# Patient Record
Sex: Male | Born: 1975 | Race: Black or African American | Hispanic: No | Marital: Single | Smoking: Current every day smoker
Health system: Southern US, Community
[De-identification: ages and names within clinical notes are randomized; demographics above are authoritative.]

## PROBLEM LIST (undated history)

## (undated) DIAGNOSIS — M329 Systemic lupus erythematosus, unspecified: Secondary | ICD-10-CM

## (undated) DIAGNOSIS — R040 Epistaxis: Secondary | ICD-10-CM

## (undated) DIAGNOSIS — T7840XA Allergy, unspecified, initial encounter: Secondary | ICD-10-CM

## (undated) DIAGNOSIS — IMO0002 Reserved for concepts with insufficient information to code with codable children: Secondary | ICD-10-CM

## (undated) DIAGNOSIS — I10 Essential (primary) hypertension: Secondary | ICD-10-CM

## (undated) DIAGNOSIS — M1990 Unspecified osteoarthritis, unspecified site: Secondary | ICD-10-CM

## (undated) HISTORY — DX: Allergy, unspecified, initial encounter: T78.40XA

## (undated) HISTORY — DX: Unspecified osteoarthritis, unspecified site: M19.90

---
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
## 1997-05-26 ENCOUNTER — Emergency Department (HOSPITAL_COMMUNITY): Admission: EM | Admit: 1997-05-26 | Discharge: 1997-05-26 | Payer: Self-pay | Admitting: Emergency Medicine

## 1997-12-25 ENCOUNTER — Encounter: Payer: Self-pay | Admitting: Emergency Medicine

## 1997-12-25 ENCOUNTER — Inpatient Hospital Stay (HOSPITAL_COMMUNITY): Admission: EM | Admit: 1997-12-25 | Discharge: 1998-01-03 | Payer: Self-pay | Admitting: Emergency Medicine

## 1997-12-26 ENCOUNTER — Encounter: Payer: Self-pay | Admitting: Surgery

## 1997-12-28 ENCOUNTER — Encounter: Payer: Self-pay | Admitting: Surgery

## 1998-01-02 ENCOUNTER — Encounter: Payer: Self-pay | Admitting: Surgery

## 1998-01-13 ENCOUNTER — Inpatient Hospital Stay (HOSPITAL_COMMUNITY): Admission: AD | Admit: 1998-01-13 | Discharge: 1998-01-14 | Payer: Self-pay | Admitting: *Deleted

## 1999-07-16 ENCOUNTER — Emergency Department (HOSPITAL_COMMUNITY): Admission: EM | Admit: 1999-07-16 | Discharge: 1999-07-16 | Payer: Self-pay | Admitting: Emergency Medicine

## 2000-02-19 HISTORY — PX: ABDOMINAL SURGERY: SHX537

## 2001-06-06 ENCOUNTER — Emergency Department (HOSPITAL_COMMUNITY): Admission: EM | Admit: 2001-06-06 | Discharge: 2001-06-06 | Payer: Self-pay | Admitting: Emergency Medicine

## 2008-11-08 ENCOUNTER — Ambulatory Visit: Payer: Self-pay | Admitting: Physician Assistant

## 2008-11-08 DIAGNOSIS — F430 Acute stress reaction: Secondary | ICD-10-CM | POA: Insufficient documentation

## 2008-11-08 DIAGNOSIS — R82998 Other abnormal findings in urine: Secondary | ICD-10-CM | POA: Insufficient documentation

## 2008-11-08 LAB — CONVERTED CEMR LAB
Bilirubin Urine: NEGATIVE
Blood in Urine, dipstick: NEGATIVE
Glucose, Urine, Semiquant: NEGATIVE
Ketones, urine, test strip: NEGATIVE
Nitrite: NEGATIVE
Protein, U semiquant: 100
Specific Gravity, Urine: >=1.03
Urobilinogen, UA: 0.2
pH: 6

## 2008-11-11 ENCOUNTER — Encounter (INDEPENDENT_AMBULATORY_CARE_PROVIDER_SITE_OTHER): Payer: Self-pay | Admitting: *Deleted

## 2008-11-11 LAB — CONVERTED CEMR LAB
ALT: 60 units/L — ABNORMAL HIGH (ref 0–53)
ANA Titer 1: NEGATIVE
AST: 40 units/L — ABNORMAL HIGH (ref 0–37)
Albumin: 4.6 g/dL (ref 3.5–5.2)
Alkaline Phosphatase: 68 units/L (ref 39–117)
Anti Nuclear Antibody(ANA): POSITIVE — AB
BUN: 14 mg/dL (ref 6–23)
Basophils Absolute: 0 10*3/uL (ref 0.0–0.1)
Basophils Relative: 1 % (ref 0–1)
CO2: 24 meq/L (ref 19–32)
Calcium: 9.6 mg/dL (ref 8.4–10.5)
Chloride: 105 meq/L (ref 96–112)
Creatinine, Ser: 1.04 mg/dL (ref 0.40–1.50)
Eosinophils Absolute: 0.1 10*3/uL (ref 0.0–0.7)
Eosinophils Relative: 1 % (ref 0–5)
Glucose, Bld: 83 mg/dL (ref 70–99)
HCT: 44.5 % (ref 39.0–52.0)
Hemoglobin: 15 g/dL (ref 13.0–17.0)
Lymphocytes Relative: 47 % — ABNORMAL HIGH (ref 12–46)
Lymphs Abs: 2 10*3/uL (ref 0.7–4.0)
MCHC: 33.7 g/dL (ref 30.0–36.0)
MCV: 84.6 fL (ref 78.0–100.0)
Monocytes Absolute: 0.6 10*3/uL (ref 0.1–1.0)
Monocytes Relative: 13 % — ABNORMAL HIGH (ref 3–12)
Neutro Abs: 1.6 10*3/uL — ABNORMAL LOW (ref 1.7–7.7)
Neutrophils Relative %: 38 % — ABNORMAL LOW (ref 43–77)
Platelets: 226 10*3/uL (ref 150–400)
Potassium: 4.6 meq/L (ref 3.5–5.3)
RBC: 5.26 M/uL (ref 4.22–5.81)
RDW: 14.2 % (ref 11.5–15.5)
Sed Rate: 10 mm/hr (ref 0–16)
Sodium: 139 meq/L (ref 135–145)
TSH: 1.252 microintl units/mL (ref 0.350–4.500)
Total Bilirubin: 0.3 mg/dL (ref 0.3–1.2)
Total Protein: 8 g/dL (ref 6.0–8.3)
WBC: 4.2 10*3/uL (ref 4.0–10.5)

## 2008-11-14 ENCOUNTER — Encounter: Payer: Self-pay | Admitting: Physician Assistant

## 2008-12-07 ENCOUNTER — Encounter: Payer: Self-pay | Admitting: Physician Assistant

## 2008-12-16 ENCOUNTER — Encounter: Payer: Self-pay | Admitting: Physician Assistant

## 2008-12-16 DIAGNOSIS — L930 Discoid lupus erythematosus: Secondary | ICD-10-CM

## 2009-12-22 ENCOUNTER — Encounter: Payer: Self-pay | Admitting: Physician Assistant

## 2010-01-05 ENCOUNTER — Encounter: Payer: Self-pay | Admitting: Physician Assistant

## 2010-02-20 ENCOUNTER — Ambulatory Visit
Admission: RE | Admit: 2010-02-20 | Discharge: 2010-02-20 | Payer: Self-pay | Source: Home / Self Care | Attending: Nurse Practitioner | Admitting: Nurse Practitioner

## 2010-02-20 ENCOUNTER — Ambulatory Visit: Admit: 2010-02-20 | Payer: Self-pay | Admitting: Internal Medicine

## 2010-02-22 ENCOUNTER — Ambulatory Visit
Admission: RE | Admit: 2010-02-22 | Discharge: 2010-02-22 | Payer: Self-pay | Source: Home / Self Care | Attending: Internal Medicine | Admitting: Internal Medicine

## 2010-03-20 NOTE — Letter (Signed)
Summary: MAILED REQUESTED RECORDS TO [facility]  MAILED REQUESTED RECORDS TO [facility]   Imported By: Arta Bruce 12/22/2009 12:21:36  _____________________________________________________________________  External Attachment:    Type:   Image     Comment:   External Document

## 2010-03-20 NOTE — Letter (Signed)
Summary: MAILED REQUESTED RECORDS TO [facility]  MAILED REQUESTED RECORDS TO [facility]   Imported By: Arta Bruce 01/05/2010 11:56:04  _____________________________________________________________________  External Attachment:    Type:   Image     Comment:   External Document

## 2010-03-21 ENCOUNTER — Ambulatory Visit: Admit: 2010-03-21 | Payer: Self-pay | Admitting: Nurse Practitioner

## 2010-03-21 ENCOUNTER — Encounter (INDEPENDENT_AMBULATORY_CARE_PROVIDER_SITE_OTHER): Payer: Self-pay | Admitting: Nurse Practitioner

## 2010-03-21 ENCOUNTER — Encounter: Payer: Self-pay | Admitting: Nurse Practitioner

## 2010-03-21 DIAGNOSIS — K029 Dental caries, unspecified: Secondary | ICD-10-CM | POA: Insufficient documentation

## 2010-03-21 DIAGNOSIS — R030 Elevated blood-pressure reading, without diagnosis of hypertension: Secondary | ICD-10-CM | POA: Insufficient documentation

## 2010-03-22 LAB — CONVERTED CEMR LAB
Alkaline Phosphatase: 68 units/L (ref 39–117)
BUN: 12 mg/dL (ref 6–23)
Eosinophils Absolute: 0.1 10*3/uL (ref 0.0–0.7)
Eosinophils Relative: 2 % (ref 0–5)
Glucose, Bld: 67 mg/dL — ABNORMAL LOW (ref 70–99)
HCT: 42.3 % (ref 39.0–52.0)
Lymphs Abs: 1.4 10*3/uL (ref 0.7–4.0)
MCV: 85.5 fL (ref 78.0–100.0)
Platelets: 242 10*3/uL (ref 150–400)
Sodium: 140 meq/L (ref 135–145)
Total Bilirubin: 0.4 mg/dL (ref 0.3–1.2)
Total Protein: 7.5 g/dL (ref 6.0–8.3)
WBC: 3.3 10*3/uL — ABNORMAL LOW (ref 4.0–10.5)

## 2010-03-26 ENCOUNTER — Encounter (INDEPENDENT_AMBULATORY_CARE_PROVIDER_SITE_OTHER): Payer: Self-pay | Admitting: Nurse Practitioner

## 2010-03-26 LAB — CONVERTED CEMR LAB
HCV Ab: NEGATIVE
Hep A Total Ab: NEGATIVE

## 2010-03-28 ENCOUNTER — Telehealth (INDEPENDENT_AMBULATORY_CARE_PROVIDER_SITE_OTHER): Payer: Self-pay | Admitting: Nurse Practitioner

## 2010-03-28 DIAGNOSIS — R748 Abnormal levels of other serum enzymes: Secondary | ICD-10-CM | POA: Insufficient documentation

## 2010-03-28 NOTE — Assessment & Plan Note (Signed)
Summary: Lupus/Elevated BP   Vital Signs:  Patient profile:   35 year old male Height:      66.50 inches Weight:      184.1 pounds BMI:     29.38 Temp:     97.0 degrees F oral Pulse rate:   72 / minute Pulse rhythm:   regular Resp:     16 per minute BP sitting:   130 / 100  (left arm) Cuff size:   large  Vitals Entered By: Levon Hedger (March 21, 2010 10:06 AM)  Nutrition Counseling: Patient's BMI is greater than 25 and therefore counseled on weight management options. CC: needs referral to dermatology and to the dentist Is Patient Diabetic? No Pain Assessment Patient in pain? no       Does patient need assistance? Functional Status Self care Ambulation Normal   CC:  needs referral to dermatology and to the dentist.  History of Present Illness:  Pt into the office for f/u.  Lupus - Pt was dx after going to dermatology.  Pt was started on both pills and cream.  Pt reports that he has not followed up as ordered because his eligibility expired. He was not able to f/u with dermatology and so he is not taking the pills.    Dental - No dental exam in the past several years. Pt does have a tooth in his left lower molar that needs extraction.  He is requesting dental clnic referral  Habits & Providers  Alcohol-Tobacco-Diet     Alcohol drinks/day: <1     Alcohol type: beer     Feels need to cut down: yes     Feels annoyed by complaints: no     Feels guilty re: drinking: no     Needs 'eye opener' in am: no     Tobacco Status: current     Tobacco Counseling: to quit use of tobacco products     Cigarette Packs/Day: <0.25     Year Started: 1995  Exercise-Depression-Behavior     Does Patient Exercise: yes     Type of exercise: treadmill, lift weights     Exercise (avg: min/session): 30-60     Times/week: 3     STD Risk: never     Drug Use: never     Seat Belt Use: always  Medications Prior to Update: 1)  None  Current Medications (verified): 1)   None  Allergies (verified): No Known Drug Allergies  Review of Systems General:  Complains of fatigue; denies fever. CV:  Denies chest pain or discomfort. Resp:  Denies cough. GI:  Denies abdominal pain, nausea, and vomiting. Derm:  Complains of rash.  Physical Exam  General:  alert.   Head:  normocephalic.   Lungs:  normal breath sounds.   Heart:  normal rate and regular rhythm.   Abdomen:  normal bowel sounds.   Msk:  up to the exam table Neurologic:  alert & oriented X3.   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 Rx by dermatology  Problem # 2:  ELEVATED BLOOD PRESSURE WITHOUT DIAGNOSIS OF HYPERTENSION (ICD-796.2)  DASH diet reviewed advised pt to monitor Orders: T-Comprehensive Metabolic Panel [phone]) T-CBC w/Diff ([id number]) T-TSH ([id number])  Problem # 3:  DENTAL CARIES (ICD-521.00) will refer to dental clinic Orders: Dental Referral (Dentist)  Complete Medication List: 1)  Plaquenil 200 Mg Tabs (Hydroxychloroquine sulfate) .... Rx by dermatology 2)  Fluocinonide 0.05 % Oint (Fluocinonide) .... Rx by derm  Other Orders: Dermatology Referral (Derma)  Patient Instructions: 1)  Elevated blood pressure - your blood pressure is 130/100 today.  This is slighlty elevated.  Be sure to monitor the salt in your diet.   2)  Be sure to monitor this when you go to your local [facility] or [facility] 3)  Eye exam - You can get a basic eye exam from [facility]. 4)  Skin - you have lupus.  You will need referral back to dermatology for managment.   5)  Dental - This will be sent to the  dental clinic.  The dental clinic will call you directly with the time/date of the referral. 6)  Follow up here as needed   Orders Added: 1)  Est. Patient Level III [57846] 2)  Dental Referral [Dentist] 3)  Dermatology Referral [Derma] 4)  T-Comprehensive Metabolic Panel [[id number]] 5)  T-CBC w/Diff [[id number]] 6)  T-TSH [[id number]]

## 2010-03-28 NOTE — Letter (Signed)
Summary: DENTAL REFERRAL  DENTAL REFERRAL   Imported By: Arta Bruce 03/21/2010 11:17:50  _____________________________________________________________________  External Attachment:    Type:   Image     Comment:   External Document

## 2010-03-28 NOTE — Letter (Signed)
Summary: Handout Printed  Printed Handout:  - Lupus

## 2010-03-29 ENCOUNTER — Encounter (INDEPENDENT_AMBULATORY_CARE_PROVIDER_SITE_OTHER): Payer: Self-pay | Admitting: *Deleted

## 2010-03-31 ENCOUNTER — Emergency Department (HOSPITAL_COMMUNITY)
Admission: EM | Admit: 2010-03-31 | Discharge: 2010-03-31 | Disposition: A | Discharge status: Home / Self Care | Payer: Self-pay | Attending: Emergency Medicine | Admitting: Emergency Medicine

## 2010-03-31 DIAGNOSIS — R040 Epistaxis: Secondary | ICD-10-CM | POA: Insufficient documentation

## 2010-03-31 DIAGNOSIS — F17200 Nicotine dependence, unspecified, uncomplicated: Secondary | ICD-10-CM | POA: Insufficient documentation

## 2010-04-01 ENCOUNTER — Emergency Department (HOSPITAL_COMMUNITY)
Admission: EM | Admit: 2010-04-01 | Discharge: 2010-04-01 | Disposition: A | Discharge status: Home / Self Care | Payer: Self-pay | Attending: Emergency Medicine | Admitting: Emergency Medicine

## 2010-04-01 DIAGNOSIS — M329 Systemic lupus erythematosus, unspecified: Secondary | ICD-10-CM | POA: Insufficient documentation

## 2010-04-01 DIAGNOSIS — I10 Essential (primary) hypertension: Secondary | ICD-10-CM | POA: Insufficient documentation

## 2010-04-01 DIAGNOSIS — R040 Epistaxis: Secondary | ICD-10-CM | POA: Insufficient documentation

## 2010-04-02 ENCOUNTER — Emergency Department (HOSPITAL_COMMUNITY)
Admission: EM | Admit: 2010-04-02 | Discharge: 2010-04-03 | Disposition: A | Discharge status: Other Acute Inpatient Hospital | Payer: Self-pay | Attending: Emergency Medicine | Admitting: Emergency Medicine

## 2010-04-02 ENCOUNTER — Emergency Department (HOSPITAL_COMMUNITY)
Admission: EM | Admit: 2010-04-02 | Discharge: 2010-04-02 | Disposition: A | Discharge status: Home / Self Care | Payer: Self-pay | Attending: Emergency Medicine | Admitting: Emergency Medicine

## 2010-04-02 DIAGNOSIS — M329 Systemic lupus erythematosus, unspecified: Secondary | ICD-10-CM | POA: Insufficient documentation

## 2010-04-02 DIAGNOSIS — F17200 Nicotine dependence, unspecified, uncomplicated: Secondary | ICD-10-CM | POA: Insufficient documentation

## 2010-04-02 DIAGNOSIS — R040 Epistaxis: Secondary | ICD-10-CM | POA: Insufficient documentation

## 2010-04-02 DIAGNOSIS — I10 Essential (primary) hypertension: Secondary | ICD-10-CM | POA: Insufficient documentation

## 2010-04-02 LAB — POCT I-STAT, CHEM 8
BUN: 19 mg/dL (ref 6–23)
Chloride: 104 mEq/L (ref 96–112)
Creatinine, Ser: 1.2 mg/dL (ref 0.4–1.5)
Potassium: 3.7 mEq/L (ref 3.5–5.1)
Sodium: 140 mEq/L (ref 135–145)

## 2010-04-02 LAB — CBC
HCT: 33.8 % — ABNORMAL LOW (ref 39.0–52.0)
Hemoglobin: 11.6 g/dL — ABNORMAL LOW (ref 13.0–17.0)
MCH: 28.9 pg (ref 26.0–34.0)
MCHC: 34.3 g/dL (ref 30.0–36.0)

## 2010-04-02 LAB — DIFFERENTIAL
Lymphocytes Relative: 42 % (ref 12–46)
Lymphs Abs: 2 10*3/uL (ref 0.7–4.0)
Monocytes Absolute: 0.4 10*3/uL (ref 0.1–1.0)
Monocytes Relative: 9 % (ref 3–12)
Neutro Abs: 2.3 10*3/uL (ref 1.7–7.7)

## 2010-04-05 ENCOUNTER — Emergency Department (HOSPITAL_COMMUNITY)
Admission: EM | Admit: 2010-04-05 | Discharge: 2010-04-06 | Disposition: A | Discharge status: Home / Self Care | Payer: Self-pay | Attending: Emergency Medicine | Admitting: Emergency Medicine

## 2010-04-05 DIAGNOSIS — R040 Epistaxis: Secondary | ICD-10-CM | POA: Insufficient documentation

## 2010-04-05 DIAGNOSIS — Y849 Medical procedure, unspecified as the cause of abnormal reaction of the patient, or of later complication, without mention of misadventure at the time of the procedure: Secondary | ICD-10-CM | POA: Insufficient documentation

## 2010-04-05 DIAGNOSIS — IMO0002 Reserved for concepts with insufficient information to code with codable children: Secondary | ICD-10-CM | POA: Insufficient documentation

## 2010-04-05 NOTE — Letter (Signed)
Summary: Generic Letter  [facility]  [address] [place], [place] [address]   Phone: [phone]  Fax: [phone]       03/29/2010  DEITRICH STEVE [address] [place], [place]  [address]  Dear Mr. Rainford,   We have been unable to reach you by phone please contact our office.   Sincerely,   Gaylyn Cheers RN

## 2010-04-05 NOTE — Letter (Signed)
Summary: *HSN Results Follow up  [facility]  [address] [place], [place] [address]   Phone: [phone]  Fax: [phone]      03/29/2010   Ray Davis [address] [place], [place]  [address]   Dear  [name],                            ____S.Drinkard,FNP   ____D. Gore,FNP       ____B. McPherson,MD   ____V. Rankins,MD    ____E. Mulberry,MD    ____N. Daphine Deutscher, FNP  ____D. Reche Dixon, MD    ____K. Philipp Deputy, MD    ____Other     This letter is to inform you that your recent test(s):  _______Pap Smear    ___X____Lab Test     _______X-ray    _______ is within acceptable limits  _______ requires a medication change  ____X___ requires a follow-up lab visit  _______ requires a follow-up visit with your provider   Comments:  We have been trying to contact you.  Please give the office a call.       _________________________________________________________ If you have any questions, please contact our office                     Sincerely,  [place] Shannon [facility]

## 2010-04-06 DIAGNOSIS — R040 Epistaxis: Secondary | ICD-10-CM | POA: Insufficient documentation

## 2010-04-06 LAB — DIFFERENTIAL
Basophils Absolute: 0 10*3/uL (ref 0.0–0.1)
Basophils Relative: 0 % (ref 0–1)
Eosinophils Relative: 0 % (ref 0–5)
Lymphocytes Relative: 18 % (ref 12–46)
Neutro Abs: 8.9 10*3/uL — ABNORMAL HIGH (ref 1.7–7.7)

## 2010-04-06 LAB — CBC
HCT: 26.7 % — ABNORMAL LOW (ref 39.0–52.0)
RDW: 13.2 % (ref 11.5–15.5)
WBC: 12.1 10*3/uL — ABNORMAL HIGH (ref 4.0–10.5)

## 2010-04-11 NOTE — Progress Notes (Addendum)
Summary: lab results  Phone Note Outgoing Call   Summary of Call: advise pt that his wbc are low during recent lab visit. will recheck in 4-6 weeks. likely caused by virus liver labs were elevated.  hepatitis is negative so unsure of cause of liver elevation. does the pt drink alcohol?  Initial call taken by: Lehman Prom FNP,  March 28, 2010 2:36 PM  Follow-up for Phone Call        pt number is not accepting incoming calls.... will mail letter.Marland KitchenMarland KitchenMarland KitchenArmenia Shannon  March 29, 2010 11:40 AM   pt number is not accepting incoming calls.Marland KitchenMarland KitchenArmenia Shannon  April 03, 2010 11:06 AM   New Problems: OTHER NONSPECIFIC ABNORMAL SERUM ENZYME LEVELS (ICD-790.5)   New Problems: OTHER NONSPECIFIC ABNORMAL SERUM ENZYME LEVELS (ICD-790.5)  Appended Document: lab results pt says he has been back and forth to hospital since Sat.  because he has blood coming out  nose and throat.... pt says he knows because its draining and he is spitting out blood... pt says he does drink about 2-40 oz. a day.... pt wants to wait to make lab appt because provider might want to see pt.Marland KitchenMarland KitchenArmenia Shannon  April 06, 2010 3:20 PM  I see ER note has been scanned in EMR along with labs done in hospital and procedure note.  Be sure that pt has adequate f/u with ENT or whomever Marilynne Drivers has deemed necessary for him to f/u with. For sake of liver enzymes he should decrease his beer intake but will recheck after his current situation stablizes n.martin,fnp  April 09, 2010  8:01 AM  Pt. advised of provider's response -- has appt. with ENT this afternoon to remove packing.  Will advise re f/u labs after ENT visit.  Verbalized understanding and agreement.  Dutch Quint RN  April 09, 2010 11:15 AM
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
## 2010-04-11 NOTE — Op Note (Signed)
NAME:  Ray Davis, Ray Davis NO.:  [id number]  MEDICAL RECORD NO.:  [id number]           PATIENT TYPE:  E  LOCATION:  WLED                         FACILITY:  [facility]  PHYSICIAN:  Newman Pies, MD            DATE OF BIRTH:  1975/11/19  DATE OF PROCEDURE:  04/03/2010 DATE OF DISCHARGE:  04/03/2010                              OPERATIVE REPORT   PREOPERATIVE DIAGNOSIS:  Severe recurrent left epistaxis.  POSTOPERATIVE DIAGNOSIS:  Severe recurrent left posterior epistaxis.  PROCEDURE PERFORMED:  Left endoscopic control of nasal hemorrhage.  ANESTHESIA:  General endotracheal tube anesthesia.  COMPLICATIONS:  None.  ESTIMATED BLOOD LOSS:  50 mL.  COMPLICATIONS:  None.  INDICATIONS FOR PROCEDURE:  The patient is a 35 year old African American male with a history of recurrent epistaxis over the past 3 days.  The patient was seen in the emergency room 3 times over the past 3 days.  His left epistaxis was packed with the Rhino Rocket and Merocel without significant improvement in the bleeding.  In light of the persistent bleeding, the decision was made for the patient to undergo control of the left nasal hemorrhage in the operating room.  The risks, benefits, alternatives, and details of the procedure were discussed with the patient and his fiancee.  Questions were invited and answered. Informed consent was obtained.  DESCRIPTION OF PROCEDURE:  The patient was taken to the operating room and placed supine on the operating table.  General endotracheal tube anesthesia was administered by the anesthesiologist.  The patient was positioned and prepped and draped in a standard fashion for nasal surgery.  Pledgets soaked with Afrin were placed in both nasal cavities for vasoconstriction.  The pledgets were removed.  Endoscopic evaluation of the right nasal cavity revealed no acute bleeding.  However, large amount of blood clots were noted within the left nasal cavity.  The blood  clots were suctioned.  Endoscopic evaluation of the left nasal cavity revealed multiple bleeding sources from the left posterior lateral nasal wall as well as the nasal septum.  The bleeding sources were cauterized.  The superior nasal cavity was then packed with Surgicel.  Good hemostasis was achieved.  FloSeal was subsequently used to fill the nasal cavity.  A 10 cm anterior- posterior Merocel packing was then placed. Again, the patient was turned over to the anesthesiologist.  The patient was awakened from anesthesia without difficulty.  He was extubated and transferred to the recovery room in good condition.  OPERATIVE FINDINGS:  Left severe posterior epistaxis.  SPECIMEN:  None.  FOLLOWUP CARE:  The patient will be discharged home once he is awake and alert.  I would like to leave the Merocel packing in place for 1 week. He will be placed on Vicodin 1-2 tablets p.o. q.4-6 hours p.r.n. pain, and Keflex 500 mg p.o. q.i.d. for 7 days.  The patient will follow up in my office in 1 week for packing removal.     Newman Pies, MD     ST/MEDQ  D:  04/03/2010  T:  04/03/2010  Job:  [id number]  Electronically  Signed by Newman Pies MD on 04/11/2010 11:33:17 AM

## 2010-04-30 ENCOUNTER — Encounter: Payer: Self-pay | Admitting: Nurse Practitioner

## 2010-04-30 ENCOUNTER — Encounter (INDEPENDENT_AMBULATORY_CARE_PROVIDER_SITE_OTHER): Payer: Self-pay | Admitting: Nurse Practitioner

## 2010-05-08 ENCOUNTER — Encounter (INDEPENDENT_AMBULATORY_CARE_PROVIDER_SITE_OTHER): Payer: Self-pay | Admitting: Nurse Practitioner

## 2010-05-08 LAB — CONVERTED CEMR LAB
ANA Titer 1: NEGATIVE
Basophils Relative: 0 % (ref 0–1)
Hemoglobin: 9.7 g/dL — ABNORMAL LOW (ref 13.0–17.0)
Lymphocytes Relative: 51 % — ABNORMAL HIGH (ref 12–46)
Lymphs Abs: 1.9 10*3/uL (ref 0.7–4.0)
MCHC: 31.1 g/dL (ref 30.0–36.0)
Monocytes Absolute: 0.3 10*3/uL (ref 0.1–1.0)
Monocytes Relative: 9 % (ref 3–12)
Neutro Abs: 1.4 10*3/uL — ABNORMAL LOW (ref 1.7–7.7)
RBC: 3.53 M/uL — ABNORMAL LOW (ref 4.22–5.81)
WBC: 3.7 10*3/uL — ABNORMAL LOW (ref 4.0–10.5)

## 2010-05-08 NOTE — Letter (Signed)
Summary: HEALTH SCREENING  HEALTH SCREENING   Imported By: Arta Bruce 05/04/2010 11:53:02  _____________________________________________________________________  External Attachment:    Type:   Image     Comment:   External Document

## 2010-05-08 NOTE — Assessment & Plan Note (Signed)
Summary: Epistaxis   Vital Signs:  Patient profile:   35 year old male Weight:      178.7 pounds BMI:     28.51 Temp:     98.1 degrees F oral Pulse rate:   75 / minute Pulse rhythm:   regular Resp:     20 per minute BP sitting:   128 / 80  (left arm) Cuff size:   large  Vitals Entered By: Levon Hedger (April 30, 2010 2:52 PM)  Nutrition Counseling: Patient's BMI is greater than 25 and therefore counseled on weight management options. CC: follow-up visit [facility]...needs bloodwork Is Patient Diabetic? No Pain Assessment Patient in pain? no       Does patient need assistance? Functional Status Self care Ambulation Normal   CC:  follow-up visit [facility]...needs bloodwork.  History of Present Illness:  Pt into the office for f/u on epistasis. Pt was seen initially at [facility] long for procedure which attempted to stop bleeding. Unsuccessful so he was advised to go to [facility] and he had another procedure there. Pt missed his last appt with for f/u.  He called and rescheduled the appt  Dermatology - ? is nose bleeding has to do with lupus. He has been referred to a specialist for further work up but pt is not sure to where he has been referred.  Labs done done on last visit showed that he had elevated liver enzymes and decreased WBC. Pt has since decreased his ETOH intake.  Labs done at hospital showed that WBC has normalized  Habits & Providers  Alcohol-Tobacco-Diet     Alcohol drinks/day: <1     Alcohol type: beer     Feels need to cut down: yes     Feels annoyed by complaints: no     Feels guilty re: drinking: no     Needs 'eye opener' in am: no     Tobacco Status: current     Tobacco Counseling: to quit use of tobacco products     Cigarette Packs/Day: <0.25     Year Started: 1995  Exercise-Depression-Behavior     Does Patient Exercise: yes     Type of exercise: treadmill, lift weights     Exercise (avg: min/session): 30-60  Times/week: 3     STD Risk: never     Drug Use: never     Seat Belt Use: always  Allergies (verified): No Known Drug Allergies  Review of Systems General:  Denies fever. CV:  Denies difficulty breathing at night. Resp:  Denies cough. GI:  Denies abdominal pain, nausea, and vomiting.  Physical Exam  General:  alert.   Head:  normocephalic.   Lungs:  normal breath sounds.   Heart:  normal rate and regular rhythm.   Msk:  normal ROM.   Neurologic:  alert & oriented X3.   Skin:  facial discoloration from lupus Psych:  Oriented X3.     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 Rx by dermatology  Problem # 3:  ELEVATED BLOOD PRESSURE WITHOUT DIAGNOSIS OF HYPERTENSION (ICD-796.2) BP is stable today DASH diet  Complete Medication List: 1)  Plaquenil 200 Mg Tabs (Hydroxychloroquine sulfate) .Marland KitchenMarland KitchenMarland Kitchen  Rx by dermatology 2)  Fluocinonide 0.05 % Oint (Fluocinonide) .... Rx by derm  Patient Instructions: 1)  Medications -  2)  Get the pharmacist at [facility] to call the pharmacist here at [facility] pharmacy to transfer the prescription ordered by the Dermatologist.  if it is something available at [facility] pharmacy or something that can be ordered then the medication will cost $10 3)  Labs - bloodwork for your specialist appointment can be done in this office to help lower cost.  Call Dermatology and ask them to fax an order for blood work to this office [phone].   4)  You will then need to schedule a lab visit for get this blood drawn.  You can then return to get a copy of the lab results to take with you to the specialist. 5)  Follow up as  needed   Orders Added: 1)  Est. Patient Level III [45409]

## 2011-06-26 ENCOUNTER — Other Ambulatory Visit (HOSPITAL_COMMUNITY): Payer: Self-pay | Admitting: Internal Medicine

## 2011-06-26 ENCOUNTER — Ambulatory Visit (HOSPITAL_COMMUNITY)
Admission: RE | Admit: 2011-06-26 | Discharge: 2011-06-26 | Disposition: A | Discharge status: Home / Self Care | Payer: Self-pay | Source: Ambulatory Visit | Attending: Internal Medicine | Admitting: Internal Medicine

## 2011-06-26 DIAGNOSIS — M25569 Pain in unspecified knee: Secondary | ICD-10-CM | POA: Insufficient documentation

## 2011-06-26 DIAGNOSIS — R52 Pain, unspecified: Secondary | ICD-10-CM

## 2012-01-07 ENCOUNTER — Emergency Department (HOSPITAL_COMMUNITY)
Admission: EM | Admit: 2012-01-07 | Discharge: 2012-01-07 | Disposition: A | Discharge status: Home / Self Care | Payer: Self-pay | Source: Home / Self Care

## 2012-01-07 ENCOUNTER — Encounter (HOSPITAL_COMMUNITY): Payer: Self-pay | Admitting: *Deleted

## 2012-01-07 DIAGNOSIS — R030 Elevated blood-pressure reading, without diagnosis of hypertension: Secondary | ICD-10-CM

## 2012-01-07 DIAGNOSIS — K029 Dental caries, unspecified: Secondary | ICD-10-CM

## 2012-01-07 DIAGNOSIS — G8929 Other chronic pain: Secondary | ICD-10-CM | POA: Insufficient documentation

## 2012-01-07 DIAGNOSIS — L930 Discoid lupus erythematosus: Secondary | ICD-10-CM

## 2012-01-07 HISTORY — DX: Systemic lupus erythematosus, unspecified: M32.9

## 2012-01-07 HISTORY — DX: Reserved for concepts with insufficient information to code with codable children: IMO0002

## 2012-01-07 HISTORY — DX: Essential (primary) hypertension: I10

## 2012-01-07 MED ORDER — TRAMADOL HCL 50 MG PO TABS: 50.0000 mg | ORAL_TABLET | Freq: Four times a day (QID) | ORAL | Status: DC | PRN

## 2012-01-07 MED ORDER — NAPROXEN 500 MG PO TABS: 500.0000 mg | ORAL_TABLET | Freq: Two times a day (BID) | ORAL | Status: DC

## 2012-01-07 NOTE — ED Provider Notes (Signed)
History     CSN: [id number]  Arrival date & time 01/07/12  1300   None     Chief Complaint  Patient presents with  . Medication Refill  . Hypertension   HPI  Pt reports that he is having a flare of joint pains related to his chronic lupus.  He has not seen his rheumatologist in several months and reports that he is out of his medications and says that he does not know the name of the medications.   He says that his joints hurt all the time especially the knees and reports that he is taking advil and ibuprofen with no significant relief in symptoms.  Pt says that he is fatigued.  He did not bring his old records with him but is willing to go get them from [facility].    Past Medical History  Diagnosis Date  . Lupus   . Hypertension     History reviewed. No pertinent past surgical history.  Family History  Problem Relation Age of Onset  . Family history unknown: Yes    History  Substance Use Topics  . Smoking status: Current Every Day Smoker -- 4.0 packs/day    Types: Cigarettes, Cigars  . Smokeless tobacco: Not on file  . Alcohol Use: 1.2 oz/week    2 Cans of beer per week     Comment: 2 40 oz beer daily     Review of Systems  Constitutional: Positive for diaphoresis and fatigue. Negative for chills, activity change, appetite change and unexpected weight change.  HENT: Negative.   Eyes: Negative.   Respiratory: Negative.   Cardiovascular: Negative.   Gastrointestinal: Negative.   Musculoskeletal: Positive for back pain, joint swelling and arthralgias.  Neurological: Negative.   Hematological: Negative.   Psychiatric/Behavioral: Negative.     Allergies  Review of patient's allergies indicates no known allergies.  Home Medications  No current outpatient prescriptions on file.  BP 116/114  Pulse 68  Temp 98.2 F (36.8 C) (Oral)  Resp 18  SpO2 99%  Physical Exam  Constitutional: He is oriented to person, place, and time. He appears well-developed and  well-nourished. No distress.  Eyes: Pupils are equal, round, and reactive to light.  Neck: Normal range of motion. Neck supple. No JVD present. No thyromegaly present.  Cardiovascular: Normal rate and regular rhythm.   Pulmonary/Chest: Effort normal and breath sounds normal.  Abdominal: Soft. Bowel sounds are normal. He exhibits no distension. There is no tenderness.  Musculoskeletal: He exhibits edema and tenderness.       Mild edema knees (suspect bilateral effusions) no crepitus but TTP    Lymphadenopathy:    He has no cervical adenopathy.  Neurological: He is alert and oriented to person, place, and time.  Skin: Skin is warm and dry. Rash noted. There is erythema.       Malar rash and hypopigmentation on face around nasolabial folds seen    ED Course  Procedures (including critical care time)  Labs Reviewed - No data to display No results found.   No diagnosis found.    MDM  Systemic Lupus - possible flare Arthralgias - chronic History of HTN - pt has not been taking meds for quite some time  I have asked pt to please sign for release of his medical records and to call and schedule appt to see his RHEUM ASAP Follow up in 1 week - at that time we should have his records  Trial of naproxen 500 mg po  bid with meals, plus tramadol 50 mg - take 1-2 tabs every 6 hours prn severe joint pain   Cleora Fleet, MD, CDE, FAAFP [facility] [place], [place]          Cleora Fleet, MD 01/07/12 [phone]

## 2012-01-07 NOTE — ED Notes (Signed)
Pt reports needing medication for htn and lupus ( internal). States that he is unable to get referral for lupus md. Has great amount of pain in joints and knees r/t to lupus. Drinking 2 + 40 oz. Of beer a day in order to deal with pain. Is currently on cream and pills for lupus but does not know the name  .

## 2012-02-19 HISTORY — PX: NOSE SURGERY: SHX723

## 2013-04-23 ENCOUNTER — Other Ambulatory Visit: Payer: Self-pay | Admitting: Family Medicine

## 2013-04-30 ENCOUNTER — Other Ambulatory Visit: Payer: Self-pay | Admitting: Family Medicine

## 2013-07-09 ENCOUNTER — Other Ambulatory Visit: Payer: Self-pay | Admitting: Family Medicine

## 2013-07-27 ENCOUNTER — Emergency Department (INDEPENDENT_AMBULATORY_CARE_PROVIDER_SITE_OTHER)
Admission: EM | Admit: 2013-07-27 | Discharge: 2013-07-27 | Disposition: A | Discharge status: Home / Self Care | Payer: No Typology Code available for payment source | Source: Home / Self Care | Attending: Family Medicine | Admitting: Family Medicine

## 2013-07-27 ENCOUNTER — Encounter (HOSPITAL_COMMUNITY): Payer: Self-pay | Admitting: Emergency Medicine

## 2013-07-27 DIAGNOSIS — M329 Systemic lupus erythematosus, unspecified: Secondary | ICD-10-CM

## 2013-07-27 DIAGNOSIS — I10 Essential (primary) hypertension: Secondary | ICD-10-CM

## 2013-07-27 LAB — POCT I-STAT, CHEM 8
BUN: 15 mg/dL (ref 6–23)
CALCIUM ION: 1.24 mmol/L — AB (ref 1.12–1.23)
CREATININE: 1.3 mg/dL (ref 0.50–1.35)
Chloride: 103 mEq/L (ref 96–112)
GLUCOSE: 108 mg/dL — AB (ref 70–99)
HCT: 43 % (ref 39.0–52.0)
HEMOGLOBIN: 14.6 g/dL (ref 13.0–17.0)
Potassium: 3.9 mEq/L (ref 3.7–5.3)
Sodium: 141 mEq/L (ref 137–147)
TCO2: 23 mmol/L (ref 0–100)

## 2013-07-27 MED ORDER — CLOBETASOL PROPIONATE 0.05 % EX OINT: 1.0000 "application " | TOPICAL_OINTMENT | Freq: Two times a day (BID) | CUTANEOUS | Status: DC

## 2013-07-27 MED ORDER — AMLODIPINE BESYLATE 10 MG PO TABS: 10.0000 mg | ORAL_TABLET | Freq: Every day | ORAL | Status: DC

## 2013-07-27 MED ORDER — TRIAMCINOLONE ACETONIDE 0.1 % EX CREA: 1.0000 "application " | TOPICAL_CREAM | Freq: Two times a day (BID) | CUTANEOUS | Status: DC

## 2013-07-27 NOTE — ED Provider Notes (Signed)
.   Ray Davis is a 38 y.o. male who presents to Urgent Care today for medication refill. Patient has a history of lupus. He has run out of the to corticosteroid creams that he uses on his face. He's been out of his blood pressure medication for over one year. He states that he has trouble affording doctor visits. He denies any fevers or chills nausea vomiting or diarrhea; he denies any chest pains palpitations or shortness of breath.   Past Medical History  Diagnosis Date  . Lupus   . Hypertension    History  Substance Use Topics  . Smoking status: Current Every Day Smoker -- 4.00 packs/day    Types: Cigarettes, Cigars  . Smokeless tobacco: Not on file  . Alcohol Use: 1.2 oz/week    2 Cans of beer per week     Comment: 2 40 oz beer daily    ROS as above Medications: No current facility-administered medications for this encounter.   Current Outpatient Prescriptions  Medication Sig Dispense Refill  . amLODipine (NORVASC) 10 MG tablet Take 1 tablet (10 mg total) by mouth daily.  30 tablet  0  . clobetasol ointment (TEMOVATE) 0.05 % Apply 1 application topically 2 (two) times daily.  30 g  1  . naproxen (NAPROSYN) 500 MG tablet Take 1 tablet (500 mg total) by mouth 2 (two) times daily with a meal.  30 tablet  0  . traMADol (ULTRAM) 50 MG tablet Take 1 tablet (50 mg total) by mouth every 6 (six) hours as needed for pain.  30 tablet  1  . triamcinolone cream (KENALOG) 0.1 % Apply 1 application topically 2 (two) times daily.  30 g  1    Exam:  BP 167/108  Pulse 68  Temp(Src) 98.4 F (36.9 C) (Oral)  Resp 16  SpO2 98% Gen: Well NAD HEENT: EOMI,  MMM Lungs: Normal work of breathing. CTABL Heart: RRR no MRG Abd: NABS, Soft. NT, ND Exts: Brisk capillary refill, warm and well perfused.  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 Please see discharge instructions. Patient expresses understanding.    Rodolph Bong, MD 07/27/13 [phone]

## 2013-07-27 NOTE — Discharge Instructions (Signed)
Thank you for coming in today. Please follow up with a doctor.  Call or go to the emergency room if you get worse, have trouble breathing, have chest pains, or palpitations.   PRIMARY CARE Merchant navy officer at [employer] [address]  [place], [place] [phone]  Fax [phone]  Nature conservation officer at [facility] [address]  [place], [place] [phone]  Fax [phone]  Nature conservation officer at [place] [facility] [phone], [place] [phone]  Fax [phone]  [facility] at [facility] [address]  [place], [place] [phone]  Fax [phone]  [facility] [address] [place] [address] [place], [place] [phone]  Fax [phone]  [facility] at [facility] [address] [place], [place] [phone]  Fax [phone]   [facility] @ [place] [address] [place] [address] Phone: [phone]   [facility] @ [facility] [address] [place] [address] Phone: [phone]   [facility] @ [place] [address] [place] [address] Phone: [phone]   [facility] @ [place] [address] [place] [address] Phone: [phone]   [facility] @ [place] [address] [place] [address] Phone: [phone] Fax: [phone]   [facility] @ [place] [address] [place] [address] Phone: [phone]     Arterial Hypertension Arterial hypertension (high blood pressure) is a condition of elevated pressure in your blood vessels. Hypertension over a long period of time is a risk factor for strokes, heart attacks, and heart failure. It is also the leading cause of kidney (renal) failure.  CAUSES   In  Adults -- Over 90% of all hypertension has no known cause. This is called essential or primary hypertension. In the other 10% of people with hypertension, the increase in blood pressure is caused by another disorder. This is called secondary hypertension. Important causes of secondary hypertension are:  Heavy alcohol use.  Obstructive sleep apnea.  Hyperaldosterosim (Conn's syndrome).  Steroid use.  Chronic kidney failure.  Hyperparathyroidism.  Medications.  Renal artery stenosis.  Pheochromocytoma.  Cushing's disease.  Coarctation of the aorta.  Scleroderma renal crisis.  Licorice (in excessive amounts).  Drugs (cocaine, methamphetamine). Your caregiver can explain any items above that apply to you.  In Children -- Secondary hypertension is more common and should always be considered.  Pregnancy -- Few women of childbearing age have high blood pressure. However, up to 10% of them develop hypertension of pregnancy. Generally, this will not harm the woman. It may be a sign of 3 complications of pregnancy: preeclampsia, HELLP syndrome, and eclampsia. Follow up and control with medication is necessary. SYMPTOMS   This condition normally does not produce any noticeable symptoms. It is usually found during a routine exam.  Malignant hypertension is a late problem of high blood pressure. It may have the following symptoms:  Headaches.  Blurred vision.  End-organ damage (this means your kidneys, heart, lungs, and other organs are being damaged).  Stressful situations can increase the blood pressure. If a person with normal blood pressure has their blood pressure go up while being seen by their caregiver, this is often termed "white coat hypertension." Its importance is not known.  It may be related with eventually developing hypertension or complications of hypertension.  Hypertension is often confused with mental tension, stress, and anxiety. DIAGNOSIS  The diagnosis is  made by 3 separate blood pressure measurements. They are taken at least 1 week apart from each other. If there is organ damage from hypertension, the diagnosis may be made without repeat measurements. Hypertension is usually identified by having blood pressure readings:  Above 140/90 mmHg measured in both arms, at 3 separate times, over a couple weeks.  Over 130/80 mmHg should be considered a risk factor and may require treatment in patients with diabetes. Blood pressure readings over 120/80 mmHg are called "pre-hypertension" even in non-diabetic patients. To get a true blood pressure measurement, use the following guidelines. Be aware of the factors that can alter blood pressure readings.  Take measurements at least 1 hour after caffeine.  Take measurements 30 minutes after smoking and without any stress. This is another reason to quit smoking  it raises your blood pressure.  Use a proper cuff size. Ask your caregiver if you are not sure about your cuff size.  Most home blood pressure cuffs are automatic. They will measure systolic and diastolic pressures. The systolic pressure is the pressure reading at the start of sounds. Diastolic pressure is the pressure at which the sounds disappear. If you are elderly, measure pressures in multiple postures. Try sitting, lying or standing.  Sit at rest for a minimum of 5 minutes before taking measurements.  You should not be on any medications like decongestants. These are found in many cold medications.  Record your blood pressure readings and review them with your caregiver. If you have hypertension:  Your caregiver may do tests to be sure you do not have secondary hypertension (see "causes" above).  Your caregiver may also look for signs of metabolic syndrome. This is also called Syndrome X or Insulin Resistance Syndrome. You may have this syndrome if you have type 2 diabetes, abdominal obesity, and abnormal blood lipids in addition to  hypertension.  Your caregiver will take your medical and family history and perform a physical exam.  Diagnostic tests may include blood tests (for glucose, cholesterol, potassium, and kidney function), a urinalysis, or an EKG. Other tests may also be necessary depending on your condition. PREVENTION  There are important lifestyle issues that you can adopt to reduce your chance of developing hypertension:  Maintain a normal weight.  Limit the amount of salt (sodium) in your diet.  Exercise often.  Limit alcohol intake.  Get enough potassium in your diet. Discuss specific advice with your caregiver.  Follow a DASH diet (dietary approaches to stop hypertension). This diet is rich in fruits, vegetables, and low-fat dairy products, and avoids certain fats. PROGNOSIS  Essential hypertension cannot be cured. Lifestyle changes and medical treatment can lower blood pressure and reduce complications. The prognosis of secondary hypertension depends on the underlying cause. Many people whose hypertension is controlled with medicine or lifestyle changes can live a normal, healthy life.  RISKS AND COMPLICATIONS  While high blood pressure alone is not an illness, it often requires treatment due to its short- and long-term effects on many organs. Hypertension increases your risk for:  CVAs or strokes (cerebrovascular accident).  Heart failure due to chronically high blood pressure (hypertensive cardiomyopathy).  Heart attack (myocardial infarction).  Damage to the retina (hypertensive retinopathy).  Kidney failure (hypertensive nephropathy). Your caregiver can explain list items above that apply to you. Treatment of hypertension can significantly  reduce the risk of complications. TREATMENT   For overweight patients, weight loss and regular exercise are recommended. Physical fitness lowers blood pressure.  Mild hypertension is usually treated with diet and exercise. A diet rich in fruits and  vegetables, fat-free dairy products, and foods low in fat and salt (sodium) can help lower blood pressure. Decreasing salt intake decreases blood pressure in a 1/3 of people.  Stop smoking if you are a smoker. The steps above are highly effective in reducing blood pressure. While these actions are easy to suggest, they are difficult to achieve. Most patients with moderate or severe hypertension end up requiring medications to bring their blood pressure down to a normal level. There are several classes of medications for treatment. Blood pressure pills (antihypertensives) will lower blood pressure by their different actions. Lowering the blood pressure by 10 mmHg may decrease the risk of complications by as much as 25%. The goal of treatment is effective blood pressure control. This will reduce your risk for complications. Your caregiver will help you determine the best treatment for you according to your lifestyle. What is excellent treatment for one person, may not be for you. HOME CARE INSTRUCTIONS   Do not smoke.  Follow the lifestyle changes outlined in the "Prevention" section.  If you are on medications, follow the directions carefully. Blood pressure medications must be taken as prescribed. Skipping doses reduces their benefit. It also puts you at risk for problems.  Follow up with your caregiver, as directed.  If you are asked to monitor your blood pressure at home, follow the guidelines in the "Diagnosis" section above. SEEK MEDICAL CARE IF:   You think you are having medication side effects.  You have recurrent headaches or lightheadedness.  You have swelling in your ankles.  You have trouble with your vision. SEEK IMMEDIATE MEDICAL CARE IF:   You have sudden onset of chest pain or pressure, difficulty breathing, or other symptoms of a heart attack.  You have a severe headache.  You have symptoms of a stroke (such as sudden weakness, difficulty speaking, difficulty  walking). MAKE SURE YOU:   Understand these instructions.  Will watch your condition.  Will get help right away if you are not doing well or get worse. Document Released: 02/04/2005 Document Revised: 04/29/2011 Document Reviewed: 09/04/2006 [facility] Patient Information 2014 [place], [place].  Lupus Lupus (also called systemic lupus erythematosus, SLE) is a disorder of the body's natural defense system (immune system). In lupus, the immune system attacks various areas of the body (autoimmune disease). CAUSES The cause is unknown. However, lupus runs in families. Certain genes can make you more likely to develop lupus. It is 10 times more common in women than in men. Lupus is also more common in African Americans and Asians. Other factors also play a role, such as viruses (Epstein-Barr virus, EBV), stress, hormones, cigarette smoke, and certain drugs. SYMPTOMS Lupus can affect many parts of the body, including the joints, skin, kidneys, lungs, heart, nervous system, and blood vessels. The signs and symptoms of lupus differ from person to person. The disease can range from mild to life-threatening. Typical features of lupus include:  Butterfly-shaped rash over the face.  Arthritis involving one or more joints.  Kidney disease.  Fever, weight loss, hair loss, fatigue.  Poor circulation in the fingers and toes (Raynaud's disease).  Chest pain when taking deep breaths. Abdominal pain may also occur.  Skin rash in areas exposed to the sun.  Sores in the mouth and  nose. DIAGNOSIS Diagnosing lupus can take a long time and is often difficult. An exam and an accurate account of your symptoms and health problems is very important. Blood tests are necessary, though no single test can confirm or rule out lupus. Most people with lupus test positive for antinuclear antibodies (ANA) on a blood test. Additional blood tests, a urine test (urinalysis), and sometimes a kidney or skin tissue sample  (biopsy) can help to confirm or rule out lupus. TREATMENT There is no cure for lupus. Your caregiver will develop a treatment plan based on your age, sex, health, symptoms, and lifestyle. The goals are to prevent flares, to treat them when they do occur, and to minimize organ damage and complications. How the disease may affect each person varies widely. Most people with lupus can live normal lives, but this disorder must be carefully monitored. Treatment must be adjusted as necessary to prevent serious complications. Medicines used for treatment:  Nonsteroidal anti-inflammatory drugs (NSAIDs) decrease inflammation and can help with chest pain, joint pain, and fevers. Examples include ibuprofen and naproxen.  Antimalarial drugs were designed to treat malaria. They also treat fatigue, joint pain, skin rashes, and inflammation of the lungs in patients with lupus.  Corticosteroids are powerful hormones that rapidly suppress inflammation. The lowest dose with the highest benefit will be chosen. They can be given by cream, pills, injections, and through the vein (intravenously).  Immunosuppressive drugs block the making of immune cells. They may be used for kidney or nerve disease. HOME CARE INSTRUCTIONS  Exercise. Low-impact activities can usually help keep joints flexible without being too strenuous.  Rest after periods of exercise.  Avoid excessive sun exposure.  Follow proper nutrition and take supplements as recommended by your caregiver.  Stress management can be helpful. SEEK MEDICAL CARE IF:  You have increased fatigue.  You develop pain.  You develop a rash.  You have an oral temperature above 102 F (38.9 C).  You develop abdominal discomfort.  You develop a headache.  You experience dizziness. FOR MORE INFORMATION National Institute of Neurological Disorders and Stroke: [employer] of Rheumatology: www.rheumatology.[facility] of  Arthritis and Musculoskeletal and Skin Diseases: www.niams.http://www.myers.net/ Document Released: 01/25/2002 Document Revised: 04/29/2011 Document Reviewed: 05/18/2009 [facility] Patient Information 2014 [place], [place].

## 2013-07-27 NOTE — ED Notes (Signed)
Needs medication for lupus management

## 2013-09-16 ENCOUNTER — Emergency Department (HOSPITAL_COMMUNITY): Payer: No Typology Code available for payment source

## 2013-09-16 ENCOUNTER — Encounter (HOSPITAL_COMMUNITY): Payer: Self-pay | Admitting: Emergency Medicine

## 2013-09-16 ENCOUNTER — Emergency Department (HOSPITAL_COMMUNITY)
Admission: EM | Admit: 2013-09-16 | Discharge: 2013-09-16 | Disposition: A | Discharge status: Home / Self Care | Payer: No Typology Code available for payment source | Attending: Emergency Medicine | Admitting: Emergency Medicine

## 2013-09-16 DIAGNOSIS — I10 Essential (primary) hypertension: Secondary | ICD-10-CM | POA: Insufficient documentation

## 2013-09-16 DIAGNOSIS — F458 Other somatoform disorders: Secondary | ICD-10-CM | POA: Insufficient documentation

## 2013-09-16 DIAGNOSIS — M329 Systemic lupus erythematosus, unspecified: Secondary | ICD-10-CM | POA: Insufficient documentation

## 2013-09-16 DIAGNOSIS — F17200 Nicotine dependence, unspecified, uncomplicated: Secondary | ICD-10-CM | POA: Insufficient documentation

## 2013-09-16 DIAGNOSIS — IMO0001 Reserved for inherently not codable concepts without codable children: Secondary | ICD-10-CM | POA: Insufficient documentation

## 2013-09-16 DIAGNOSIS — R51 Headache: Secondary | ICD-10-CM | POA: Insufficient documentation

## 2013-09-16 DIAGNOSIS — R079 Chest pain, unspecified: Secondary | ICD-10-CM | POA: Insufficient documentation

## 2013-09-16 LAB — URINALYSIS, ROUTINE W REFLEX MICROSCOPIC
Bilirubin Urine: NEGATIVE
Glucose, UA: NEGATIVE mg/dL
Ketones, ur: NEGATIVE mg/dL
Leukocytes, UA: NEGATIVE
Nitrite: NEGATIVE
PROTEIN: 100 mg/dL — AB
Specific Gravity, Urine: 1.022 (ref 1.005–1.030)
UROBILINOGEN UA: 1 mg/dL (ref 0.0–1.0)
pH: 6 (ref 5.0–8.0)

## 2013-09-16 LAB — CBC WITH DIFFERENTIAL/PLATELET
BASOS PCT: 0 % (ref 0–1)
Basophils Absolute: 0 10*3/uL (ref 0.0–0.1)
EOS ABS: 0 10*3/uL (ref 0.0–0.7)
Eosinophils Relative: 0 % (ref 0–5)
HCT: 41.7 % (ref 39.0–52.0)
Hemoglobin: 14.5 g/dL (ref 13.0–17.0)
Lymphocytes Relative: 12 % (ref 12–46)
Lymphs Abs: 1.9 10*3/uL (ref 0.7–4.0)
MCH: 28.5 pg (ref 26.0–34.0)
MCHC: 34.8 g/dL (ref 30.0–36.0)
MCV: 81.9 fL (ref 78.0–100.0)
MONOS PCT: 10 % (ref 3–12)
Monocytes Absolute: 1.6 10*3/uL — ABNORMAL HIGH (ref 0.1–1.0)
NEUTROS PCT: 78 % — AB (ref 43–77)
Neutro Abs: 12.2 10*3/uL — ABNORMAL HIGH (ref 1.7–7.7)
PLATELETS: 256 10*3/uL (ref 150–400)
RBC: 5.09 MIL/uL (ref 4.22–5.81)
RDW: 13.4 % (ref 11.5–15.5)
WBC: 15.7 10*3/uL — ABNORMAL HIGH (ref 4.0–10.5)

## 2013-09-16 LAB — BASIC METABOLIC PANEL
Anion gap: 18 — ABNORMAL HIGH (ref 5–15)
BUN: 13 mg/dL (ref 6–23)
CO2: 19 mEq/L (ref 19–32)
Calcium: 9.8 mg/dL (ref 8.4–10.5)
Chloride: 95 mEq/L — ABNORMAL LOW (ref 96–112)
Creatinine, Ser: 1.18 mg/dL (ref 0.50–1.35)
GFR, EST AFRICAN AMERICAN: 90 mL/min — AB (ref >90)
GFR, EST NON AFRICAN AMERICAN: 77 mL/min — AB (ref >90)
Glucose, Bld: 85 mg/dL (ref 70–99)
POTASSIUM: 3.8 meq/L (ref 3.7–5.3)
SODIUM: 132 meq/L — AB (ref 137–147)

## 2013-09-16 LAB — URINE MICROSCOPIC-ADD ON

## 2013-09-16 LAB — I-STAT TROPONIN, ED: Troponin i, poc: 0.01 ng/mL (ref 0.00–0.08)

## 2013-09-16 MED ORDER — SODIUM CHLORIDE 0.9 % IV BOLUS (SEPSIS)
1000.0000 mL | Freq: Once | INTRAVENOUS | Status: AC
Start: 2013-09-16 — End: 2013-09-16
  Administered 2013-09-16: 1000 mL via INTRAVENOUS

## 2013-09-16 MED ORDER — PREDNISONE 10 MG PO TABS: 10.0000 mg | ORAL_TABLET | Freq: Every day | ORAL | Status: DC

## 2013-09-16 MED ORDER — TRAMADOL HCL 50 MG PO TABS: 50.0000 mg | ORAL_TABLET | Freq: Four times a day (QID) | ORAL | Status: DC | PRN

## 2013-09-16 MED ORDER — KETOROLAC TROMETHAMINE 30 MG/ML IJ SOLN
30.0000 mg | Freq: Once | INTRAMUSCULAR | Status: AC
  Administered 2013-09-16: 30 mg via INTRAVENOUS
  Filled 2013-09-16: qty 1

## 2013-09-16 MED ORDER — DIPHENHYDRAMINE HCL 50 MG/ML IJ SOLN
25.0000 mg | Freq: Once | INTRAMUSCULAR | Status: AC
  Administered 2013-09-16: 25 mg via INTRAVENOUS
  Filled 2013-09-16: qty 1

## 2013-09-16 MED ORDER — PREDNISONE 20 MG PO TABS
60.0000 mg | ORAL_TABLET | Freq: Once | ORAL | Status: AC
  Administered 2013-09-16: 60 mg via ORAL
  Filled 2013-09-16: qty 3

## 2013-09-16 MED ORDER — METOCLOPRAMIDE HCL 5 MG/ML IJ SOLN
10.0000 mg | Freq: Once | INTRAMUSCULAR | Status: AC
  Administered 2013-09-16: 10 mg via INTRAVENOUS
  Filled 2013-09-16: qty 2

## 2013-09-16 NOTE — ED Provider Notes (Signed)
CSN: [id number]     Arrival date & time 09/16/13  1240 History   First MD Initiated Contact with Patient 09/16/13 1256     Chief Complaint  Patient presents with  . Chest Pain  . Headache  . Generalized Body Aches     (Consider location/radiation/quality/duration/timing/severity/associated sxs/prior Treatment) HPI Comments: Patient is a 38 year old male with a past medical history of lupus who presents with chest pain, headache and body aches for the past 3 days. Symptoms started gradually and remained constant since the onset. His chest pain started suddenly and he describes the pain as a transient sharp pain in the middle of his chest. The pain last for one second and rapidly resolves. This has occurred a few times over the past 3 days. Patient reports associated aching headache that does not radiate. The headache is moderate in severity. He also complains for body aches that are generalized. Patient has not tried anything for symptoms. He reports his symptoms feeling like a lupus flare. No aggravating/alleviating factors. No other associated symptoms.    Past Medical History  Diagnosis Date  . Lupus   . Hypertension    History reviewed. No pertinent past surgical history. No family history on file. History  Substance Use Topics  . Smoking status: Current Every Day Smoker -- 4.00 packs/day    Types: Cigarettes, Cigars  . Smokeless tobacco: Not on file  . Alcohol Use: 1.2 oz/week    2 Cans of beer per week     Comment: 2 40 oz beer daily     Review of Systems  Constitutional: Negative for fever, chills and fatigue.  HENT: Negative for trouble swallowing.   Eyes: Negative for visual disturbance.  Respiratory: Negative for shortness of breath.   Cardiovascular: Positive for chest pain. Negative for palpitations.  Gastrointestinal: Negative for nausea, vomiting, abdominal pain and diarrhea.  Genitourinary: Negative for dysuria and difficulty urinating.  Musculoskeletal: Positive  for myalgias. Negative for arthralgias and neck pain.  Skin: Negative for color change.  Neurological: Positive for headaches. Negative for dizziness and weakness.  Psychiatric/Behavioral: Negative for dysphoric mood.      Allergies  Review of patient's allergies indicates no known allergies.  Home Medications   Prior to Admission medications   Medication Sig Start Date End Date Taking? Authorizing Provider  amLODipine (NORVASC) 10 MG tablet Take 10 mg by mouth every morning.   Yes Historical Provider, MD  aspirin 325 MG tablet Take 650 mg by mouth every 6 (six) hours as needed for moderate pain.   Yes Historical Provider, MD   BP 150/116  Pulse 102  Temp(Src) 98.2 F (36.8 C) (Oral)  Resp 18  SpO2 100% Physical Exam  Nursing note and vitals reviewed. Constitutional: He is oriented to person, place, and time. He appears well-developed and well-nourished. No distress.  HENT:  Head: Normocephalic and atraumatic.  Eyes: Conjunctivae and EOM are normal. Pupils are equal, round, and reactive to light.  Neck: Normal range of motion.  Cardiovascular: Normal rate and regular rhythm.  Exam reveals no gallop and no friction rub.   No murmur heard. Pulmonary/Chest: Effort normal and breath sounds normal. He has no wheezes. He has no rales. He exhibits no tenderness.  Abdominal: Soft. He exhibits no distension. There is no tenderness. There is no rebound and no guarding.  Musculoskeletal: Normal range of motion.  Neurological: He is alert and oriented to person, place, and time. Coordination normal.  No meningeal signs. Speech is goal-oriented. Moves  limbs without ataxia.   Skin: Skin is warm and dry.  Psychiatric: He has a normal mood and affect. His behavior is normal.    ED Course  Procedures (including critical care time) Labs Review Labs Reviewed  CBC WITH DIFFERENTIAL - Abnormal; Notable for the following:    WBC 15.7 (*)    Neutrophils Relative % 78 (*)    Neutro Abs 12.2  (*)    Monocytes Absolute 1.6 (*)    All other components within normal limits  BASIC METABOLIC PANEL - Abnormal; Notable for the following:    Sodium 132 (*)    Chloride 95 (*)    GFR calc non Af Amer 77 (*)    GFR calc Af Amer 90 (*)    Anion gap 18 (*)    All other components within normal limits  URINALYSIS, ROUTINE W REFLEX MICROSCOPIC - Abnormal; Notable for the following:    Hgb urine dipstick TRACE (*)    Protein, ur 100 (*)    All other components within normal limits  URINE MICROSCOPIC-ADD ON  [facility] Sensor, ED    Imaging Review Dg Chest 2 View  09/16/2013   CLINICAL DATA:  Chest pain for 2 days.  EXAM: CHEST  2 VIEW  COMPARISON:  None available for comparison at time of study interpretation.  FINDINGS: The heart size and mediastinal contours are within normal limits. Mildly elevated right hemidiaphragm. Both lungs are clear. The visualized skeletal structures are unremarkable. Multiple EKG lines overlie the patient and may obscure subtle underlying pathology.  IMPRESSION: No active cardiopulmonary disease.   Electronically Signed   By: Awilda Metro   On: 09/16/2013 13:32     EKG Interpretation None      MDM   Final diagnoses:  Exacerbation of systemic lupus    3:18 PM Patient appears to be having a lupus flare. He currently has a low grade temp with remaining vitals stable. Patient's labs show elevated WBC at 15.7. Chest xray shows no acute changes. Patient has trace amount of hemoglobin and protein in his urine. Patient's symptoms appear to be consistent with a mild to moderate lupus flare. He will be discharged with a course of prednisone and pain medication. Patient instructed to follow up with a PCP and Rheumatologist.     Emilia Beck, PA-C 09/17/13 [phone]

## 2013-09-16 NOTE — ED Notes (Signed)
Pt c/o chest pain, headache and body aches x 3 days. States he has lupus and doesn't know if the sx are from that.

## 2013-09-16 NOTE — Discharge Instructions (Signed)
Take prednisone as directed until gone. Take Tramadol as needed for pain. Refer to attached documents for more information. Follow up with a PCP from the resource guide below.    Emergency Department Resource Guide 1) Find a Doctor and Pay Out of Pocket Although you won't have to find out who is covered by your insurance plan, it is a good idea to ask around and get recommendations. You will then need to call the office and see if the doctor you have chosen will accept you as a new patient and what types of options they offer for patients who are self-pay. Some doctors offer discounts or will set up payment plans for their patients who do not have insurance, but you will need to ask so you aren't surprised when you get to your appointment.  2) Contact Your Local Health Department Not all health departments have doctors that can see patients for sick visits, but many do, so it is worth a call to see if yours does. If you don't know where your local health department is, you can check in your phone book. The CDC also has a tool to help you locate your state's health department, and many state websites also have listings of all of their local health departments.  3) Find a Walk-in Clinic If your illness is not likely to be very severe or complicated, you may want to try a walk in clinic. These are popping up all over the country in pharmacies, drugstores, and shopping centers. They're usually staffed by nurse practitioners or physician assistants that have been trained to treat common illnesses and complaints. They're usually fairly quick and inexpensive. However, if you have serious medical issues or chronic medical problems, these are probably not your best option.  No Primary Care Doctor: - Call Health Connect at  [phone] - they can help you locate a primary care doctor that  accepts your insurance, provides certain services, etc. - Physician Referral Service- [phone]  Chronic Pain  Problems: Organization         Address  Phone   Notes  [facility]  [phone] Patients need to be referred by their primary care doctor.   Medication Assistance: Organization         Address  Phone   Notes  [facility] [address] [place], [place] [address] [phone] --Must be a resident of [place] -- Must have NO insurance coverage whatsoever (no Medicaid/ Medicare, etc.) -- The pt. MUST have a primary care doctor that directs their care regularly and follows them in the community   MedAssist  [phone]   [employer]  [phone]    Agencies that provide inexpensive medical care: Organization         Address  Phone   Notes  [facility]  [phone]   [facility]    [phone]   [facility] [address] [place], [place] [address] [phone]   [facility] [address], [place] [phone]   [facility]anned Parenthood    [phone]   [facility]    [phone]   [facility]  [address], [place] Phone:  [phone], Fax:  [phone] Hours of Operation:  9 am - 6 pm, M-F.  Also accepts Medicaid/Medicare and self-pay.  [facility]  [address], [place] Phone: [phone], Fax: [phone]. Hours of Operation:  8:30 am - 5:30 pm, M-F.  Also accepts Medicaid and self-pay.  [facility] [address], [place] Phone: [phone]   [place], [place], [place] [phone], Ext. [phone] Mondays & Thursdays: 7-9 AM.  First 15 patients are seen on a first come, first serve basis.    [place] Providers:  Organization         Address  Phone   Notes  [facility] [address], [place] [phone] Also  accepts self-pay patients.  [facility] [address], [place]  [phone]   [place], [address], [place] [phone]   [facility] [address] [place] [phone]   Lucianne Lei [address], [place]   [phone] Only accepts [place] Access [place] patients after they have their name applied to their card.   Self-Pay (no insurance) in [facility]:  Organization         Address  Phone   Notes  Sickle Cell Patients, [facility] [employer] [phone]   [facility] [place] [phone]   [facility] [place]  [place], [place], [place] [phone]   [facility]/Dr. Osei-Bonsu  [address] [place] or [address], [place] [phone] Phone number for both [place] and [place] locations is the same.  [facility] [address], [place] [phone]   [facility] [address] [place] or [address] [phone]   [facility] [address] [place] [phone], phone; [phone], fax Sees patients 1st and 3rd Saturday of every month.  Must not qualify for public or private insurance (i.e. Medicaid, Medicare, [place] Health Choice, Veterans' Benefits)  Household income should be no more than 200% of the poverty level The clinic cannot treat you if you are pregnant or think you are pregnant  Sexually transmitted diseases are not treated at the clinic.    Dental Care: Organization         Address  Phone  Notes  [facility] Department of [facility] [place] [phone] Accepts children up to age 58 who are enrolled in [place] or [place]; pregnant  women with a Medicaid card; and children who have applied for Medicaid or [place] Health Choice, but were declined, whose parents can pay a reduced fee at time of service.  [facility] Department of [facility]  [address], [place] [phone] Accepts children up to age 52 who are enrolled in [place] or [place]; pregnant women with a Medicaid card; and children who have applied for Medicaid or [place] Health Choice, but were declined, whose parents can pay a reduced fee at time of service.  [facility] PRO[facility]  [place] [phone] Patients are seen by appointment only. Walk-ins are not accepted. [place] will see patients 8 years of age and older. Monday - Tuesday (8am-5pm) Most Wednesdays (8:30-5pm) $30 per visit, cash only  [place]  PROGRAM  [address], [facility] [phone] Patients are seen by appointment only. Walk-ins are not accepted. [place] will see patients 73 years of age and older. One Wednesday Evening (Monthly: Volunteer Based).  $30 per visit, cash only  [place]  [phone] for adults; Children under age 67, call Graduate Pediatric Dentistry at [phone]. Children aged 69-14, please call [phone] to request a pediatric application.  Dental services are provided in all areas of dental care including fillings, crowns and bridges, complete and partial dentures, implants, gum treatment, root canals, and extractions. Preventive care is also provided. Treatment is provided to both adults and children. Patients are selected via a lottery and there is often a waiting list.   [facility] [address], [place]  [phone] [email or web address]   [facility] [address] [place], [place] [phone], Ext. [phone] Second and Fourth Thursday of each month, opens at 6:30 AM; Clinic ends at 9 AM.  Patients are  seen on a first-come first-served basis, and a limited number are seen during each clinic.   [facility]  [address] [place], [place] [phone]   Eligibility Requirements You must have lived in [place], [place], or [place] counties for at least the last three months.   You cannot be eligible for state or federal sponsored [employer], including [employer], [place], or [employer].   You generally cannot be eligible for healthcare insurance through your employer.    How to apply: Eligibility screenings are held every Tuesday and Wednesday afternoon from 1:00 pm until 4:00 pm. You do not need an appointment for the interview!  [facility] [address], [place], [place]   [place] Department  [place]  [phone]    Behavioral Health Resources in the Community: Intensive Outpatient Programs Organization         Address  Phone  Notes  [place]. [address], [place], [place] [phone]   [facility] Outpatient [address] [place], [place]   ADS: Alcohol & Drug Svcs [address], [place], [place]   [place] [address]  [place], [place] or [phone]   Substance Abuse Resources Organization         Address  Phone  Notes  Alcohol and Drug Services  [phone]   [place]  [phone]   [facility] [place]   Chinita Pester  [phone]   Residential & Outpatient Substance Abuse Program  [phone]   Psychological Services Organization         Address  Phone  Notes  [facility] [place]  [place]  [phone]   [place] [address], [place] or [phone]    Mobile Crisis  Teams Organization         Address  Phone  Notes  [facility], Mobile Crisis Care Unit  [phone]   [facility]  [address] [place], [place]   [place] Levels [address], [place] [phone]    Self-Help/Support Groups Organization         Address  Phone             Notes  [place]  of [place] - variety of support groups  [place] Call for more information  Narcotics Anonymous (NA), [facility] [address], [employer] [place]  2 meetings at this location   Special educational needs teacher         Address  Phone  Notes  [facility]idential [facility] [place],    [place]  [phone]   [facility]  [address], [place] [address]4008, [place], [place]   [place], [place] [phone] Admissions: 8am-3pm M-F  Incentives Substance [place] [address],    [place], [place] [phone]   [facility] [address] [place], [place], [place]   [facility] [address]  [place], [place]   [facility] Programs - Intensive Outpatient [address], [place], [place]   [facility] ([place] [address].,  [place], [place] [phone] or [phone]   [facility] Services (RTS) [address], [place], [place] Accepts Medicaid  [facility] [place] [address],  [place] [phone] Substance Abuse/Addiction Treatment   [facility] Organization         Address  Phone  Notes  [facility]  [phone]   Domenic Schwab, PhD [address] [place], [place]   [phone] or [phone]   [facility], [place] [phone]   [facility] [address], [place], [place] [phone]  Insurance/Medicaid/sponsorship through [facility] and Families [address]                                    [place], [place] [phone] [place] [address], [place] [phone]    Dr. Adele Schilder  [phone]   [facility] [place] Dept. 1) [address]  2) [place] 3)  [place] [address], [place] [phone]  [phone]   [place] [phone] or [phone] (After Hours)

## 2013-09-20 NOTE — ED Provider Notes (Signed)
Medical screening examination/treatment/procedure(s) were performed by non-physician practitioner and as supervising physician I was immediately available for consultation/collaboration.   EKG Interpretation   Date/Time:  Thursday September 16 2013 12:49:41 EDT Ventricular Rate:  98 PR Interval:  143 QRS Duration: 89 QT Interval:  338 QTC Calculation: 431 R Axis:   123 Text Interpretation:  Sinus rhythm Left atrial enlargement Left posterior  fascicular block Abnormal R-wave progression, late transition ED PHYSICIAN  INTERPRETATION AVAILABLE IN [facility] Confirmed by TEST, Record  ([id number]) on 09/18/2013 1:51:28 PM        Dagmar HaitWilliam Sheretta Grumbine, MD 09/20/13 1538

## 2013-10-19 ENCOUNTER — Ambulatory Visit (INDEPENDENT_AMBULATORY_CARE_PROVIDER_SITE_OTHER): Payer: No Typology Code available for payment source | Admitting: Family Medicine

## 2013-10-19 ENCOUNTER — Other Ambulatory Visit: Payer: Self-pay | Admitting: Family Medicine

## 2013-10-19 VITALS — BP 162/110 | HR 69 | Temp 97.9°F | Resp 16 | Ht 66.0 in | Wt 194.4 lb

## 2013-10-19 DIAGNOSIS — J3489 Other specified disorders of nose and nasal sinuses: Secondary | ICD-10-CM

## 2013-10-19 DIAGNOSIS — H6120 Impacted cerumen, unspecified ear: Secondary | ICD-10-CM

## 2013-10-19 DIAGNOSIS — I10 Essential (primary) hypertension: Secondary | ICD-10-CM

## 2013-10-19 DIAGNOSIS — M2550 Pain in unspecified joint: Secondary | ICD-10-CM

## 2013-10-19 DIAGNOSIS — M329 Systemic lupus erythematosus, unspecified: Secondary | ICD-10-CM

## 2013-10-19 DIAGNOSIS — Z0000 Encounter for general adult medical examination without abnormal findings: Secondary | ICD-10-CM

## 2013-10-19 DIAGNOSIS — H6121 Impacted cerumen, right ear: Secondary | ICD-10-CM

## 2013-10-19 LAB — POCT UA - MICROSCOPIC ONLY
Casts, Ur, LPF, POC: NEGATIVE
Crystals, Ur, HPF, POC: NEGATIVE
Mucus, UA: NEGATIVE
Yeast, UA: NEGATIVE

## 2013-10-19 LAB — LIPID PANEL
Cholesterol: 137 mg/dL (ref 0–200)
HDL: 35 mg/dL — ABNORMAL LOW (ref >39)
LDL Cholesterol: 59 mg/dL (ref 0–99)
Total CHOL/HDL Ratio: 3.9 Ratio
Triglycerides: 215 mg/dL — ABNORMAL HIGH (ref <150)
VLDL: 43 mg/dL — ABNORMAL HIGH (ref 0–40)

## 2013-10-19 LAB — POCT URINALYSIS DIPSTICK
Bilirubin, UA: NEGATIVE
Blood, UA: NEGATIVE
Glucose, UA: NEGATIVE
Ketones, UA: NEGATIVE
Nitrite, UA: NEGATIVE
Protein, UA: 100
Spec Grav, UA: >=1.03
Urobilinogen, UA: 0.2
pH, UA: 5

## 2013-10-19 LAB — POCT CBC
Granulocyte percent: 40.2 %G (ref 37–80)
HCT, POC: 42.6 % — AB (ref 43.5–53.7)
Hemoglobin: 13.4 g/dL — AB (ref 14.1–18.1)
Lymph, poc: 2.1 (ref 0.6–3.4)
MCH, POC: 27.7 pg (ref 27–31.2)
MCHC: 31.5 g/dL — AB (ref 31.8–35.4)
MCV: 88 fL (ref 80–97)
MID (cbc): 0.2 (ref 0–0.9)
MPV: 6.4 fL (ref 0–99.8)
POC Granulocyte: 1.6 — AB (ref 2–6.9)
POC LYMPH PERCENT: 53.6 %L — AB (ref 10–50)
POC MID %: 6.2 %M (ref 0–12)
Platelet Count, POC: 267 10*3/uL (ref 142–424)
RBC: 4.84 M/uL (ref 4.69–6.13)
RDW, POC: 14.5 %
WBC: 4 10*3/uL — AB (ref 4.6–10.2)

## 2013-10-19 LAB — COMPLETE METABOLIC PANEL WITH GFR
ALT: 98 U/L — ABNORMAL HIGH (ref 0–53)
AST: 73 U/L — ABNORMAL HIGH (ref 0–37)
Albumin: 4.2 g/dL (ref 3.5–5.2)
Alkaline Phosphatase: 73 U/L (ref 39–117)
BUN: 13 mg/dL (ref 6–23)
CO2: 23 mEq/L (ref 19–32)
Calcium: 9.3 mg/dL (ref 8.4–10.5)
Chloride: 103 mEq/L (ref 96–112)
Creat: 0.98 mg/dL (ref 0.50–1.35)
GFR, Est African American: >89 mL/min
GFR, Est Non African American: >89 mL/min
Glucose, Bld: 84 mg/dL (ref 70–99)
Potassium: 4.1 mEq/L (ref 3.5–5.3)
Sodium: 134 mEq/L — ABNORMAL LOW (ref 135–145)
Total Bilirubin: 0.4 mg/dL (ref 0.2–1.2)
Total Protein: 7.8 g/dL (ref 6.0–8.3)

## 2013-10-19 LAB — POCT SEDIMENTATION RATE: POCT SED RATE: 38 mm/hr — AB (ref 0–22)

## 2013-10-19 MED ORDER — LOSARTAN POTASSIUM-HCTZ 100-12.5 MG PO TABS: 1.0000 | ORAL_TABLET | Freq: Every day | ORAL | Status: DC

## 2013-10-19 MED ORDER — PREDNISONE 20 MG PO TABS: 40.0000 mg | ORAL_TABLET | Freq: Every day | ORAL | Status: DC

## 2013-10-19 MED ORDER — TRIAMCINOLONE ACETONIDE 0.1 % EX CREA: 1.0000 "application " | TOPICAL_CREAM | Freq: Two times a day (BID) | CUTANEOUS | Status: DC

## 2013-10-19 MED ORDER — AMLODIPINE BESYLATE 5 MG PO TABS: 5.0000 mg | ORAL_TABLET | Freq: Every day | ORAL | Status: DC

## 2013-10-19 NOTE — Progress Notes (Signed)
Patient ID: Ray Davis MRN: [id number], DOB: 1975-06-06 38 y.o. Date of Encounter: 10/19/2013, 1:56 PM  Primary Physician: Pcp Not In System  Chief Complaint: Physical (CPE)  HPI: 38 y.o. y/o male with history noted below here for CPE.  He was recently seen in the emergency room for chest pain. I'm unable to access his electrocardiogram. He was discharged after troponin came back negative. He continues to have uncontrolled hypertension, uncontrolled Lupus, proteinuria, significant joint pain.  Patient's had lupus for at least 18 years. He's had hypertension for at least 10 of those. 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 He feels that he is probably disabled as he hurts in all joints every day including his back, hands, elbows, hips, knees.  Review of Systems: Consitutional: No fever, chills, fatigue, night sweats, lymphadenopathy, or weight changes. Eyes: No visual changes, eye redness, or discharge. ENT/Mouth: Ears: No otalgia, tinnitus, hearing loss, discharge. Nose: No congestion, rhinorrhea, sinus pain, or epistaxis. Throat: No sore throat, post nasal drip, or teeth pain. Cardiovascular: No CP, palpitations, diaphoresis, DOE, edema, orthopnea, PND. Respiratory: No cough, hemoptysis, SOB, or wheezing. Gastrointestinal: No anorexia, dysphagia, reflux, pain, nausea, vomiting, hematemesis, diarrhea, constipation, BRBPR, or melena. Genitourinary: No dysuria, frequency, urgency, hematuria, incontinence, nocturia, decreased urinary stream, discharge, impotence, or testicular pain/masses. Musculoskeletal: No decreased ROM, myalgias, stiffness, joint swelling, or weakness. Skin: No rash, erythema, lesion changes, pain, warmth, jaundice, or pruritis. Neurological: No headache, dizziness, syncope, seizures, tremors, memory loss, coordination problems, or paresthesias. Psychological: No anxiety, depression, hallucinations,  SI/HI. Endocrine: No fatigue, polydipsia, polyphagia, polyuria, or known diabetes. All other systems were reviewed and are otherwise negative.  Past Medical History  Diagnosis Date  . Lupus   . Hypertension   . Allergy   . Arthritis      History reviewed. No pertinent past surgical history.  Home Meds:  Prior to Admission medications   Medication Sig Start Date End Date Taking? Authorizing Provider  amLODipine (NORVASC) 10 MG tablet Take 10 mg by mouth every morning.   Yes Historical Provider, MD  aspirin 325 MG tablet Take 650 mg by mouth every 6 (six) hours as needed for moderate pain.   Yes Historical Provider, MD    Allergies: No Known Allergies  History   Social History  . Marital Status: Single    Spouse Name: N/A    Number of Children: N/A  . Years of Education: N/A   Occupational History  . Not on file.   Social History Main Topics  . Smoking status: Current Every Day Smoker -- 4.00 packs/day    Types: Cigarettes, Cigars  . Smokeless tobacco: Not on file  . Alcohol Use: 1.2 oz/week    2 Cans of beer per week     Comment: 2 40 oz beer daily   . Drug Use: No  . Sexual Activity: Yes    Birth Control/ Protection: None   Other Topics Concern  . Not on file   Social History Narrative  . No narrative on file    History reviewed. No pertinent family history.  Physical Exam: Blood pressure 162/110, pulse 69, temperature 97.9 F (36.6 C), temperature source Oral, resp. rate 16, height  (1.676 m), weight 194 lb 6.4 oz (88.179 kg), SpO2 100.00%.  BP Readings from Last 3 Encounters:  10/19/13 162/110  09/16/13 146/86  07/27/13 167/108   General: Well developed, well nourished, in no acute distress. HEENT: Normocephalic, atraumatic.  Conjunctiva pink, sclera non-icteric. Pupils 2 mm constricting to 1 mm, round, regular, and equally reactive to light and accomodation. EOMI. Internal auditory canal clear. TMs bilateral cerumen, right side being totally  occluded Nasal mucosa pink. Nares are without discharge. No sinus tenderness. Oral mucosa pink. Dentition dental caries in tooth #19 and #30. Examination of the nasal passages reveals crusting eroded areas of the left lateral nostril. Pharynx without exudate.     Visual Acuity  Right Eye Distance:   Left Eye Distance:   Bilateral Distance:    Right Eye Near:   Left Eye Near:    Bilateral Near:      Neck: Supple. Trachea midline. No thyromegaly. Full ROM. No lymphadenopathy. Lungs: Clear to auscultation bilaterally without wheezes, rales, or rhonchi. Breathing is of normal effort and unlabored. Cardiovascular: RRR with S1 S2. No murmurs, rubs, or gallops appreciated. Distal pulses 2+ symmetrically. No carotid or abdominal bruits Abdomen: Soft, non-tender, non-distended with normoactive bowel sounds. No hepatosplenomegaly or masses. No rebound/guarding. No CVA tenderness. Without hernias.   Genitourinary:  circumcised male. No penile lesions. Testes descended bilaterally, and smooth without tenderness or masses.  Musculoskeletal: Full range of motion and 5/5 strength throughout. Without swelling, atrophy, tenderness, crepitus, or warmth. Extremities without clubbing, cyanosis, or edema. Calves supple. Patient moves all joints slowly because of stiffness. Skin: Warm and moist . Patient has deep pigmentation around her region and the right nostril as well as the left lower back. The skin is scarred in these areas. Neuro: A+Ox3. CN II-XII grossly intact. Moves all extremities spontaneously. Full sensation throughout. Normal gait. DTR 2+ throughout upper and lower extremities. Finger to nose intact. Psych:  Responds to questions appropriately with a normal affect.   Results for orders placed during the hospital encounter of 09/16/13  CBC WITH DIFFERENTIAL      Result Value Ref Range   WBC 15.7 (*) 4.0 - 10.5 K/uL   RBC 5.09  4.22 - 5.81 MIL/uL   Hemoglobin 14.5  13.0 - 17.0 g/dL   HCT 78.2   95.6 - 21.3 %   MCV 81.9  78.0 - 100.0 fL   MCH 28.5  26.0 - 34.0 pg   MCHC 34.8  30.0 - 36.0 g/dL   RDW 08.6  57.8 - 46.9 %   Platelets 256  150 - 400 K/uL   Neutrophils Relative % 78 (*) 43 - 77 %   Neutro Abs 12.2 (*) 1.7 - 7.7 K/uL   Lymphocytes Relative 12  12 - 46 %   Lymphs Abs 1.9  0.7 - 4.0 K/uL   Monocytes Relative 10  3 - 12 %   Monocytes Absolute 1.6 (*) 0.1 - 1.0 K/uL   Eosinophils Relative 0  0 - 5 %   Eosinophils Absolute 0.0  0.0 - 0.7 K/uL   Basophils Relative 0  0 - 1 %   Basophils Absolute 0.0  0.0 - 0.1 K/uL  BASIC METABOLIC PANEL      Result Value Ref Range   Sodium 132 (*) 137 - 147 mEq/L   Potassium 3.8  3.7 - 5.3 mEq/L   Chloride 95 (*) 96 - 112 mEq/L   CO2 19  19 - 32 mEq/L   Glucose, Bld 85  70 - 99 mg/dL   BUN 13  6 - 23 mg/dL   Creatinine, Ser 6.29  0.50 - 1.35 mg/dL   Calcium 9.8  8.4 - 52.8 mg/dL   GFR calc non Af Amer 77 (*) >90  mL/min   GFR calc Af Amer 90 (*) >90 mL/min   Anion gap 18 (*) 5 - 15  URINALYSIS, ROUTINE W REFLEX MICROSCOPIC      Result Value Ref Range   Color, Urine YELLOW  YELLOW   APPearance CLEAR  CLEAR   Specific Gravity, Urine 1.022  1.005 - 1.030   pH 6.0  5.0 - 8.0   Glucose, UA NEGATIVE  NEGATIVE mg/dL   Hgb urine dipstick TRACE (*) NEGATIVE   Bilirubin Urine NEGATIVE  NEGATIVE   Ketones, ur NEGATIVE  NEGATIVE mg/dL   Protein, ur 161 (*) NEGATIVE mg/dL   Urobilinogen, UA 1.0  0.0 - 1.0 mg/dL   Nitrite NEGATIVE  NEGATIVE   Leukocytes, UA NEGATIVE  NEGATIVE  URINE MICROSCOPIC-ADD ON      Result Value Ref Range   Squamous Epithelial / LPF RARE  RARE   WBC, UA 0-2  <3 WBC/hpf   RBC / HPF 3-6  <3 RBC/hpf  I-STAT TROPOININ, ED      Result Value Ref Range   Troponin i, poc 0.01  0.00 - 0.08 ng/mL   Comment 3           EKG today:  Mild V2 ST elevation, q waves II, III, aVF Results for orders placed in visit on 10/19/13  POCT CBC      Result Value Ref Range   WBC 4.0 (*) 4.6 - 10.2 K/uL   Lymph, poc 2.1  0.6 - 3.4    POC LYMPH PERCENT 53.6 (*) 10 - 50 %L   MID (cbc) 0.2  0 - 0.9   POC MID % 6.2  0 - 12 %M   POC Granulocyte 1.6 (*) 2 - 6.9   Granulocyte percent 40.2  37 - 80 %G   RBC 4.84  4.69 - 6.13 M/uL   Hemoglobin 13.4 (*) 14.1 - 18.1 g/dL   HCT, POC 09.6 (*) 04.5 - 53.7 %   MCV 88.0  80 - 97 fL   MCH, POC 27.7  27 - 31.2 pg   MCHC 31.5 (*) 31.8 - 35.4 g/dL   RDW, POC 40.9     Platelet Count, POC 267  142 - 424 K/uL   MPV 6.4  0 - 99.8 fL  POCT UA - MICROSCOPIC ONLY      Result Value Ref Range   WBC, Ur, HPF, POC 6-18     RBC, urine, microscopic 1-2     Bacteria, U Microscopic small     Mucus, UA neg     Epithelial cells, urine per micros 1-2     Crystals, Ur, HPF, POC neg     Casts, Ur, LPF, POC neg     Yeast, UA neg    POCT URINALYSIS DIPSTICK      Result Value Ref Range   Color, UA yellow     Clarity, UA hazy     Glucose, UA neg     Bilirubin, UA neg     Ketones, UA neg     Spec Grav, UA >=1.030     Blood, UA neg     pH, UA 5.0     Protein, UA 100     Urobilinogen, UA 0.2     Nitrite, UA neg     Leukocytes, UA Trace      Assessment/Plan:  38 y.o. y/o unhealthy male here for CPE.  He has significant lupus changes on his skin, marked granulomatous changes of the left nostril, bilateral dental  caries, uncontrolled hypertension, proteinuria, diffuse arthritis; in short he has diffuse multisystem lupus. Patient has not been able to afford medical insurance until recently and therefore is allowed on the emergency room for help. Unfortunately, the emergency room as neither afforded preventive care nor acute pain relief on any of his visits.  Followup 2 weeks.  Routine general medical examination at a health care facility - Plan: POCT CBC, POCT UA - Microscopic Only, POCT urinalysis dipstick  Lupus - Plan: COMPLETE METABOLIC PANEL WITH GFR, Lipid panel, ANA, POCT SEDIMENTATION RATE, Ambulatory referral to Rheumatology, losartan-hydrochlorothiazide (HYZAAR) 100-12.5 MG per tablet,  amLODipine (NORVASC) 5 MG tablet  Unspecified essential hypertension - Plan: EKG 12-Lead, COMPLETE METABOLIC PANEL WITH GFR, Lipid panel, ANA, POCT SEDIMENTATION RATE  Cerumen impaction, right - Plan: Ear wax removal, Ear wax removal  Arthralgia  Nostril sore - Plan: Ambulatory referral to ENT  Signed, Elvina Sidle, MD    Signed, Elvina Sidle, MD 10/19/2013 1:56 PM

## 2013-10-20 LAB — HEPATITIS PANEL, ACUTE
HCV Ab: NEGATIVE
Hep A IgM: NONREACTIVE
Hep B C IgM: NONREACTIVE
Hepatitis B Surface Ag: NEGATIVE

## 2013-10-20 LAB — ANA: Anti Nuclear Antibody(ANA): NEGATIVE

## 2013-11-11 DIAGNOSIS — L930 Discoid lupus erythematosus: Secondary | ICD-10-CM

## 2013-12-01 ENCOUNTER — Other Ambulatory Visit: Payer: Self-pay | Admitting: Family Medicine

## 2014-04-15 ENCOUNTER — Other Ambulatory Visit: Payer: Self-pay | Admitting: Family Medicine

## 2014-04-26 ENCOUNTER — Encounter (HOSPITAL_COMMUNITY): Payer: Self-pay

## 2014-04-26 ENCOUNTER — Emergency Department (HOSPITAL_COMMUNITY)
Admission: EM | Admit: 2014-04-26 | Discharge: 2014-04-26 | Disposition: A | Discharge status: Home / Self Care | Payer: No Typology Code available for payment source | Attending: Emergency Medicine | Admitting: Emergency Medicine

## 2014-04-26 DIAGNOSIS — Z720 Tobacco use: Secondary | ICD-10-CM | POA: Insufficient documentation

## 2014-04-26 DIAGNOSIS — K088 Other specified disorders of teeth and supporting structures: Secondary | ICD-10-CM | POA: Insufficient documentation

## 2014-04-26 DIAGNOSIS — M3210 Systemic lupus erythematosus, organ or system involvement unspecified: Secondary | ICD-10-CM | POA: Insufficient documentation

## 2014-04-26 DIAGNOSIS — K0889 Other specified disorders of teeth and supporting structures: Secondary | ICD-10-CM

## 2014-04-26 DIAGNOSIS — I159 Secondary hypertension, unspecified: Secondary | ICD-10-CM | POA: Insufficient documentation

## 2014-04-26 DIAGNOSIS — M329 Systemic lupus erythematosus, unspecified: Secondary | ICD-10-CM

## 2014-04-26 DIAGNOSIS — K029 Dental caries, unspecified: Secondary | ICD-10-CM | POA: Insufficient documentation

## 2014-04-26 DIAGNOSIS — Z7952 Long term (current) use of systemic steroids: Secondary | ICD-10-CM | POA: Insufficient documentation

## 2014-04-26 DIAGNOSIS — M1990 Unspecified osteoarthritis, unspecified site: Secondary | ICD-10-CM | POA: Insufficient documentation

## 2014-04-26 MED ORDER — OXYCODONE-ACETAMINOPHEN 5-325 MG PO TABS
1.0000 | ORAL_TABLET | Freq: Once | ORAL | Status: AC
  Administered 2014-04-26: 1 via ORAL
  Filled 2014-04-26: qty 1

## 2014-04-26 MED ORDER — AMLODIPINE BESYLATE 5 MG PO TABS: 5.0000 mg | ORAL_TABLET | Freq: Every day | ORAL | Status: DC

## 2014-04-26 MED ORDER — LOSARTAN POTASSIUM-HCTZ 100-12.5 MG PO TABS: 1.0000 | ORAL_TABLET | Freq: Every day | ORAL | Status: DC

## 2014-04-26 MED ORDER — BUPIVACAINE-EPINEPHRINE (PF) 0.5% -1:200000 IJ SOLN
1.8000 mL | Freq: Once | INTRAMUSCULAR | Status: DC
  Filled 2014-04-26: qty 1.8

## 2014-04-26 MED ORDER — HYDROCODONE-ACETAMINOPHEN 5-325 MG PO TABS: 1.0000 | ORAL_TABLET | ORAL | Status: DC | PRN

## 2014-04-26 MED ORDER — AMOXICILLIN 500 MG PO CAPS: 500.0000 mg | ORAL_CAPSULE | Freq: Three times a day (TID) | ORAL | Status: DC

## 2014-04-26 NOTE — Discharge Instructions (Signed)
Dental Pain °A tooth ache may be caused by cavities (tooth decay). Cavities expose the nerve of the tooth to air and hot or cold temperatures. It may come from an infection or abscess (also called a boil or furuncle) around your tooth. It is also often caused by dental caries (tooth decay). This causes the pain you are having. °DIAGNOSIS  °Your caregiver can diagnose this problem by exam. °TREATMENT  °· If caused by an infection, it may be treated with medications which kill germs (antibiotics) and pain medications as prescribed by your caregiver. Take medications as directed. °· Only take over-the-counter or prescription medicines for pain, discomfort, or fever as directed by your caregiver. °· Whether the tooth ache today is caused by infection or dental disease, you should see your dentist as soon as possible for further care. °SEEK MEDICAL CARE IF: °The exam and treatment you received today has been provided on an emergency basis only. This is not a substitute for complete medical or dental care. If your problem worsens or new problems (symptoms) appear, and you are unable to meet with your dentist, call or return to this location. °SEEK IMMEDIATE MEDICAL CARE IF:  °· You have a fever. °· You develop redness and swelling of your face, jaw, or neck. °· You are unable to open your mouth. °· You have severe pain uncontrolled by pain medicine. °MAKE SURE YOU:  °· Understand these instructions. °· Will watch your condition. °· Will get help right away if you are not doing well or get worse. °Document Released: 02/04/2005 Document Revised: 04/29/2011 Document Reviewed: 09/23/2007 °ExitCare® Patient Information ©2015 [employer]. This information is not intended to replace advice given to you by your health care provider. Make sure you discuss any questions you have with your health care provider. ° ° °RESOURCE GUIDE ° °Chronic Pain Problems: °Contact [facility]  297-2271 °Patients need to be  referred by their primary care doctor. ° °Insufficient Money for Medicine: °Contact United Way:  call "211" or [facility] 271-5999. ° °No Primary Care Doctor: °Call [facility]  832-8000 - can help you locate a primary care doctor that  accepts your insurance, provides certain services, etc. °Physician Referral Service- 1-800-533-3463 ° °Agencies that provide inexpensive medical care: °[facility]  832-8035 °[facility]  832-7272 °[facility]  [phone] °[facility] Clinic  832-4777 °Planned Parenthood  373-0678 °[facility]  272-1050 ° °Medicaid-accepting [place] Providers: °[facility]- [address] ° [phone], Mon-Fri 9am-7pm, Sat 9am-1pm °[facility]- [address], [address] ° [phone] °[facility]- [address] ° [phone] °[facility]- [address] [place] [address] ° [phone] °[facility]- [address], [phone] ° Only accepts [place] Access Medicaid patients after they have their name  applied to their card ° °Self Pay (no insurance) in [place]: °Sickle Cell Patients: Dr Eric Dean, [facility] ° [address], [phone] °[facility]- [address] ° [phone] °      -     [facility]- [address] °      -     [facility]- see information above (Speak to Pam H if you do not have insurance) °      -  [facility]- [address], [phone] °      -  [facility] [place]- [address],  [phone] °      -  [facility]- [address] [place] [address], [phone] °      -  Dr Osei-Bonsu-  [address], [place], [phone] °      -  [facility]- [address], [phone] °      -  [facility]- [address] [place] [address], [phone], also [address], [phone] °      -     [facility]- [address], [phone], 1st & 3rd Saturday   every month, 10am-1pm ° °1) Find a Doctor and Pay Out of Pocket °Although you won't have to find out who is covered by your insurance plan, it is a good idea to ask around and get recommendations. You will then need to call the office and see if the doctor you have chosen will accept you as a new patient and what types of options they offer for patients who are self-pay. Some doctors offer discounts or will set up payment plans for their patients who do not have insurance, but you will need to ask so you aren't surprised when you get to your appointment. ° °2) Contact Your Local Health Department °Not all health departments have doctors that can see patients for sick visits, but many do, so it is worth a call to see if yours does. If you don't know where your local health department is, you can check in your phone book. The CDC also has a tool to help you locate your state's health department, and many state websites also have listings of all of their local health departments. ° °3) Find a Walk-in Clinic °If your illness is not likely to be very severe or complicated, you may want to try a walk in clinic. These are popping up all over the country in pharmacies, drugstores, and shopping centers. They're usually staffed by nurse practitioners or physician assistants that have been trained to treat common illnesses and complaints. They're usually fairly quick and inexpensive. However, if you have serious medical issues or chronic medical problems, these are probably not your best option ° °STD Testing °[place] [facility] [place], [facility], [address], [place], phone [phone] or 1-877-539-9860.  Monday - Friday, call for an appointment. °[place] [facility] [place], [facility], [address], [place], phone [phone] or 1-877-539-9860.  Monday - Friday, call for an  appointment. ° °Abuse/Neglect: °[place] Child Abuse Hotline (336) 641-3795 °[place] Child Abuse Hotline 800-378-5315 (After Hours) ° °Emergency Shelter:  [facility] (336) 271-5985 ° °Maternity Homes: °[facility] (336) 275-9566 °[facility] (704) 372-4663 ° °MRSA Hotline #:   832-7006 ° °[place] Resources ° °[facility] [place]  [employer] [place] [facility] °[address]                 [address]         [address]  °[place]                                               [place]                                [place] °Phone:  349-3220                                  Phone:  [phone]                   Phone:  342-8140 ° °[facility], 342-8316 °[place] [facility] - [facility]- 1-888-581-9988 °      -     [facility] in [place], [address],                                  336-349-4454, Insurance ° °[place] Child Abuse Hotline °(336) 342-1394 or (336) 342-3537 (After Hours) ° ° °Behavioral Health Services ° °Substance Abuse Resources: °[facility]  336-882-2125 °[facility] 336-784-9470 °[facility] 336-285-9073 °[facility] 336-845-3988 °Residential & Outpatient Substance Abuse Program  800-659-3381 ° °Psychological Services: °[facility]  832-9600 °[facility]  378-7881 °[facility], [address], [place], ACCESS LINE: 1-800-853-5163 or 336-641-4981, [email or web address].[email or web address] ° °Dental Assistance ° °If unable to pay or uninsured, contact:  [facility] or [facility] to become qualified for the adult dental clinic. ° °Patients with Medicaid: [facility] °[address], 632-0744 °[address], [phone] ° °If unable to pay, or uninsured, contact  [facility] (271-5999) or [place] [facility] (641-3152 in , 842-7733 in [place]) to become qualified for the adult dental clinic ° °Other Low-Cost Community Dental Services: °[facility]- [address], [place], [place], [address], 723-1848, Ext. [address], 2nd and 4th Thursday of the month at 6:30am.  10 clients each day by appointment, can sometimes see walk-in patients if someone does not show for an appointment. °[facility]- [address], [place], [place], [address], 723-7904 °[facility]- [address], [place], [place], [address], 631-2330 °[facility]- 342-8273 °[facility]- 703-3100 °[facility]- 570-6415 ° °Please make every effort to establish with a primary care physician for routine medical care ° °Adult Health Services  °The [place] [facility] provides a wide range of adult health services. Some of these services are designed to address the healthcare needs of all [place] residents and all services are designed to meet the needs of uninsured/underinsured low income residents. Some services are available to any resident of [place], call [phone] for details. °] °[facility], a new medical clinic for adults, is now open. For more information about the Center and its services please call [phone]. °For information on our [facility] services, click here. ° °For more information on any of the following Department of Public Health programs, including hours of service, click on the highlighted link. ° °SERVICES FOR WOMEN (Adults and Teens) °Family Planning Services provide a full range of birth control options plus education and counseling. New patient visit and annual return visits include a complete examination, pap test as indicated, and other laboratory as indicated. Included is our Regional Vasectomy Program  for men. ° °Maternity Care is provided through pregnancy, including a six week post partum exam. Women who meet eligibility criteria for the Medicaid for Pregnant Women program, receive care free. Other women are charged on a sliding scale according to income. °Note: Our   Dental Clinic provides services to pregnant women who have a Medicaid card. Call [phone] for an appointment in [place] or [phone] for an appointment in [place]. ° °Primary Care for Medicaid [place] Access Women is available through the [place] [facility]. As primary care provider for the [place] Medicaid [place] Access Medicaid Managed Care program, women may designate the Women’s Health clinic as their primary care provider. ° °PLEASE CALL [phone] FOR AN APPOINTMENT FOR THE ABOVE SERVICES IN EITHER [place] OR [place]. Information available in English and Spanish.  ° °Childbirth Education Classes are open to the public and offered to help families prepare for the best possible childbirth experience as well as to promote lifelong health and wellness. Classes are offered throughout the year and meet on the same night once a week for five weeks. Medicaid covers the cost of the classes for the mother-to-be and her partner. For participants without Medicaid, the cost of the class series is $45.00 for the mother-to-be and her partner. Class size is limited and registration is required. For more information or to register call 336-641-4718. Baby items donated by [employer] and the [employer] [place] are given away during each class series. ° °SERVICES FOR WOMEN AND MEN °Sexually Transmitted Infection appointments, including HIV testing, are available daily (weekdays, except holidays). Call early as same-day appointments are limited. For an appointment in either [place] or [place], call [phone]. Services are confidential and free of charge. ° °Skin Testing for Tuberculosis Please call  [phone]. °Adult Immunizations are available, usually for a fee. Please call [phone] for details. ° °PLEASE CALL [phone] FOR AN APPOINTMENT FOR THE ABOVE SERVICES IN EITHER [place] OR [place].  ° °[facility] provides up to the minute recommended vaccines for your travel destination. We also provide essential health and political information to help insure a safe and pleasurable travel experience. This program is self-sustaining, however, fees are very competitive. We are a CERTIFIED YELLOW FEVER IMMUNIZATION approved clinic site. °PLEASE CALL [phone] FOR AN APPOINTMENT IN EITHER [place] OR [place].  ° °If you have questions about the services listed above, we want to answer them! Email us at: [email or web address] °Home Visiting Services for elderly and the disabled are available to residents of [place] who are in need of care that compares to the care offered by a nursing home, have needs that can be met by the program, and have [place] Medicaid. Other short term services are available to residents 18 years and older who are unable to meet requirements for eligibility to receive services from a certified home health agency, spend the majority of time at home, and need care for six months or less. ° °PLEASE CALL [phone] OR [phone] FOR MORE INFORMATION. °Medication Assistance Program serves as a link between pharmaceutical companies and patients to provide low cost or free prescription medications. This servce is available for residents who meet certain income restrictions and have no insurance coverage. ° °PLEASE CALL [phone] ([place]) OR [phone] ([place]) FOR MORE INFORMATION.  °Updated Feb. 21, 2013 ° ° °

## 2014-04-26 NOTE — ED Provider Notes (Signed)
CSN: [id number]     Arrival date & time 04/26/14  1237 History  This chart was scribed for non-physician practitioner Marlon Peliffany Yuki Purves, PA-C working with Blake DivineJohn Wofford, MD by Littie Deedsichard Sun, ED Scribe. This patient was seen in room WTR5/WTR5 and the patient's care was started at 1:43 PM.       Chief Complaint  Patient presents with  . Dental Pain   The history is provided by the patient. No language interpreter was used.   HPI Comments: Thereasa SoloJohn Davis is a 39 y.o. male with a hx of HTN who presents to the Emergency Department complaining of gradual onset lower left dental pain that started several months ago but worsened 4-5 days ago. He has been seen by PCP/dentist for this before. He has not been on any medications for this. He denies pain or swelling under his tongue, fever, headache, CP, SOB neck pain and difficulty swallowing. Pt has known hypertension and is hypertensive today, reports being out of BP medications.  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  Past Medical History  Diagnosis Date  . Lupus   . Hypertension   . Allergy   . Arthritis    History reviewed. No pertinent past surgical history. History reviewed. No pertinent family history. History  Substance Use Topics  . Smoking status: Current Every Day Smoker -- 4.00 packs/day    Types: Cigarettes, Cigars  . Smokeless tobacco: Not on file  . Alcohol Use: 1.2 oz/week    2 Cans of beer per week     Comment: 2 40 oz beer daily     Review of Systems  HENT: Positive for dental problem.   A complete 10 system review of systems was obtained and all systems are negative except as noted in the HPI and PMH.      Allergies  Review of patient's allergies indicates no known allergies.  Home Medications   Prior to Admission medications   Medication Sig Start Date End Date Taking? Authorizing Provider  amLODipine (NORVASC) 5 MG tablet Take 1 tablet (5 mg total) by mouth daily. 04/26/14   Monice Lundy [place], PA-C   amoxicillin (AMOXIL) 500 MG capsule Take 1 capsule (500 mg total) by mouth 3 (three) times daily. 04/26/14   Yessenia Maillet [place], PA-C  aspirin 325 MG tablet Take 650 mg by mouth every 6 (six) hours as needed for moderate pain.    Historical Provider, MD  HYDROcodone-acetaminophen (NORCO/VICODIN) 5-325 MG per tablet Take 1-2 tablets by mouth every 4 (four) hours as needed. 04/26/14   Camaryn Lumbert [place], PA-C  losartan-hydrochlorothiazide (HYZAAR) 100-12.5 MG per tablet Take 1 tablet by mouth daily. 04/26/14   Jaleeyah Munce [place], PA-C  predniSONE (DELTASONE) 20 MG tablet Take 2 tablets (40 mg total) by mouth daily. 10/19/13   Elvina SidleKurt Lauenstein, MD  triamcinolone cream (KENALOG) 0.1 % apply to affected area twice a day 04/17/14   Elvina SidleKurt Lauenstein, MD   BP 162/124 mmHg  Pulse 72  Temp(Src) 98.5 F (36.9 C) (Oral)  Resp 16  SpO2 99% Physical Exam  Constitutional: He is oriented to person, place, and time. He appears well-developed and well-nourished. No distress.  HENT:  Head: Normocephalic and atraumatic.  Mouth/Throat: Oropharynx is clear and moist. No trismus in the jaw. Dental caries present. No uvula swelling. No oropharyngeal exudate.    No sublingual swelling.  Eyes: Conjunctivae and EOM are normal. Pupils are equal, round, and reactive to light.  Neck: Normal range of motion. Neck supple.  Cardiovascular: Normal rate and regular rhythm.   Pulmonary/Chest: Effort normal and breath sounds normal.  Musculoskeletal: He exhibits no edema.  Neurological: He is alert and oriented to person, place, and time. No cranial nerve deficit.  Skin: Skin is warm and dry. No rash noted.  Psychiatric: He has a normal mood and affect. His behavior is normal.  Nursing note and vitals reviewed.   ED Course  Procedures  DIAGNOSTIC STUDIES: Oxygen Saturation is 99% on room air, normal by my interpretation.    COORDINATION OF CARE: 1:45 PM-Discussed treatment plan which includes pain medication, antibiotics, work note,  and dental referral with pt at bedside and pt agreed to plan. Patient declined dental block. He will not be driving. Patient instructed to return if he has new symptoms. Pt does not have his Norvasc or Hyzaar as he has ran out- will refill, he plans to restart and needs to see his PCP.  Labs Review Labs Reviewed - No data to display  Imaging Review No results found.   EKG Interpretation None      MDM   Final diagnoses:  Toothache  Secondary hypertension, unspecified    Patient has dental pain. No emergent s/sx's present. Patent airway. No trismus.  Will be given pain medication and antibiotics. I discussed the need to call dentist within 24/48 hours for follow-up. Dental referral given. Return to ED precautions given.  Pt voiced understanding and has agreed to follow-up.   39 y.o.Chace Kloosterman's evaluation in the Emergency Department is complete. It has been determined that no acute conditions requiring further emergency intervention are present at this time. The patient/guardian have been advised of the diagnosis and plan. We have discussed signs and symptoms that warrant return to the ED, such as changes or worsening in symptoms.  Vital signs are stable at discharge. Filed Vitals:   04/26/14 1241  BP: 162/124  Pulse: 72  Temp: 98.5 F (36.9 C)  Resp: 16    Patient/guardian has voiced understanding and agreed to follow-up with the PCP or specialist.    I personally performed the services described in this documentation, which was scribed in my presence. The recorded information has been reviewed and is accurate.    Marlon Pel, PA-C 04/26/14 1426  Blake Divine, MD 04/26/14 1719

## 2014-04-26 NOTE — ED Notes (Signed)
Pt has PMH HTN, but isnt taking medications that he is supposed to be taking.

## 2014-04-26 NOTE — ED Notes (Signed)
Pt c/o L lower dental pain x "several months."  Pain score 10/10.  Pt has not taken anything for the pain.  Pt has been seen by PCP, but did not receive a referral.

## 2014-05-18 ENCOUNTER — Emergency Department (HOSPITAL_COMMUNITY): Payer: No Typology Code available for payment source

## 2014-05-18 ENCOUNTER — Encounter (HOSPITAL_COMMUNITY): Payer: Self-pay | Admitting: Emergency Medicine

## 2014-05-18 ENCOUNTER — Emergency Department (HOSPITAL_COMMUNITY)
Admission: EM | Admit: 2014-05-18 | Discharge: 2014-05-18 | Disposition: A | Discharge status: Home / Self Care | Payer: No Typology Code available for payment source | Attending: Emergency Medicine | Admitting: Emergency Medicine

## 2014-05-18 DIAGNOSIS — I10 Essential (primary) hypertension: Secondary | ICD-10-CM | POA: Insufficient documentation

## 2014-05-18 DIAGNOSIS — X58XXXA Exposure to other specified factors, initial encounter: Secondary | ICD-10-CM | POA: Insufficient documentation

## 2014-05-18 DIAGNOSIS — Y9289 Other specified places as the place of occurrence of the external cause: Secondary | ICD-10-CM | POA: Insufficient documentation

## 2014-05-18 DIAGNOSIS — Z7982 Long term (current) use of aspirin: Secondary | ICD-10-CM | POA: Insufficient documentation

## 2014-05-18 DIAGNOSIS — M1990 Unspecified osteoarthritis, unspecified site: Secondary | ICD-10-CM | POA: Insufficient documentation

## 2014-05-18 DIAGNOSIS — Y998 Other external cause status: Secondary | ICD-10-CM | POA: Insufficient documentation

## 2014-05-18 DIAGNOSIS — Z720 Tobacco use: Secondary | ICD-10-CM | POA: Insufficient documentation

## 2014-05-18 DIAGNOSIS — S8991XA Unspecified injury of right lower leg, initial encounter: Secondary | ICD-10-CM | POA: Insufficient documentation

## 2014-05-18 DIAGNOSIS — Y9389 Activity, other specified: Secondary | ICD-10-CM | POA: Insufficient documentation

## 2014-05-18 DIAGNOSIS — Z7952 Long term (current) use of systemic steroids: Secondary | ICD-10-CM | POA: Insufficient documentation

## 2014-05-18 DIAGNOSIS — Z79899 Other long term (current) drug therapy: Secondary | ICD-10-CM | POA: Insufficient documentation

## 2014-05-18 DIAGNOSIS — M25461 Effusion, right knee: Secondary | ICD-10-CM | POA: Insufficient documentation

## 2014-05-18 DIAGNOSIS — Z792 Long term (current) use of antibiotics: Secondary | ICD-10-CM | POA: Insufficient documentation

## 2014-05-18 MED ORDER — OXYCODONE-ACETAMINOPHEN 5-325 MG PO TABS
2.0000 | ORAL_TABLET | Freq: Once | ORAL | Status: AC
  Administered 2014-05-18: 2 via ORAL
  Filled 2014-05-18: qty 2

## 2014-05-18 MED ORDER — HYDROCODONE-ACETAMINOPHEN 5-325 MG PO TABS: 1.0000 | ORAL_TABLET | ORAL | Status: DC | PRN

## 2014-05-18 NOTE — ED Notes (Signed)
Per pt, states he twisted right knee yesterday

## 2014-05-18 NOTE — ED Provider Notes (Signed)
CSN: [id number]     Arrival date & time 05/18/14  0907 History   First MD Initiated Contact with Patient 05/18/14 1002     Chief Complaint  Patient presents with  . Knee Pain     (Consider location/radiation/quality/duration/timing/severity/associated sxs/prior Treatment) HPI Comments: 39 year old male presenting with right knee pain 1 day. Reports he was playing basketball yesterday and twisted his right knee, feeling a popping sensation. States his knee cap appeared to go towards the right, and suddenly towards the left. No prior history of injury to the right knee. Reports swelling increased throughout the night, causing increased pain today, worse with bending his knee or pressure. Denies numbness or tingling. Tried taking ibuprofen with minimal relief.  Patient is a 39 y.o. male presenting with knee pain. The history is provided by the patient.  Knee Pain   Past Medical History  Diagnosis Date  . Lupus   . Hypertension   . Allergy   . Arthritis    History reviewed. No pertinent past surgical history. No family history on file. History  Substance Use Topics  . Smoking status: Current Every Day Smoker -- 4.00 packs/day    Types: Cigarettes, Cigars  . Smokeless tobacco: Not on file  . Alcohol Use: 1.2 oz/week    2 Cans of beer per week     Comment: 2 40 oz beer daily     Review of Systems  Constitutional: Negative.   HENT: Negative.   Respiratory: Negative.   Cardiovascular: Negative.   Musculoskeletal:       +R knee pain and swelling.  Skin: Negative for color change and wound.  Neurological: Negative for numbness.      Allergies  Review of patient's allergies indicates no known allergies.  Home Medications   Prior to Admission medications   Medication Sig Start Date End Date Taking? Authorizing Provider  amLODipine (NORVASC) 5 MG tablet Take 1 tablet (5 mg total) by mouth daily. 04/26/14   Tiffany Neva Seat, PA-C  amoxicillin (AMOXIL) 500 MG capsule Take 1  capsule (500 mg total) by mouth 3 (three) times daily. 04/26/14   Tiffany Neva Seat, PA-C  aspirin 325 MG tablet Take 650 mg by mouth every 6 (six) hours as needed for moderate pain.    Historical Provider, MD  HYDROcodone-acetaminophen (NORCO/VICODIN) 5-325 MG per tablet Take 1-2 tablets by mouth every 4 (four) hours as needed. 05/18/14   Markham Dumlao M Abrish Erny, PA-C  losartan-hydrochlorothiazide (HYZAAR) 100-12.5 MG per tablet Take 1 tablet by mouth daily. 04/26/14   Tiffany Neva Seat, PA-C  predniSONE (DELTASONE) 20 MG tablet Take 2 tablets (40 mg total) by mouth daily. 10/19/13   Elvina Sidle, MD  triamcinolone cream (KENALOG) 0.1 % apply to affected area twice a day 04/17/14   Elvina Sidle, MD   BP 147/99 mmHg  Pulse 77  Temp(Src) 98.2 F (36.8 C) (Oral)  Resp 14  SpO2 99% Physical Exam  Constitutional: He is oriented to person, place, and time. He appears well-developed and well-nourished. No distress.  HENT:  Head: Normocephalic and atraumatic.  Eyes: Conjunctivae and EOM are normal.  Neck: Normal range of motion. Neck supple.  Cardiovascular: Normal rate, regular rhythm and normal heart sounds.   Pulses:      Posterior tibial pulses are 2+ on the right side.  Pulmonary/Chest: Effort normal and breath sounds normal.  Musculoskeletal:  Right knee TTP anterior over patella, infra and supra patella, medial and lateral with moderate swelling. Unable to assess ligamentous laxity due to pain and  swelling. Range of motion limited by pain and swelling. No erythema or warmth. No deformity.  Neurological: He is alert and oriented to person, place, and time.  Skin: Skin is warm and dry.  Psychiatric: He has a normal mood and affect. His behavior is normal.  Nursing note and vitals reviewed.   ED Course  Procedures (including critical care time) Labs Review Labs Reviewed - No data to display  Imaging Review Dg Knee Complete 4 Views Right  05/18/2014   CLINICAL DATA:  Right knee pain, landed wrong  playing basketball yesterday, severe pain  EXAM: RIGHT KNEE - COMPLETE 4+ VIEW  COMPARISON:  06/26/2011  FINDINGS: Five views of the right knee submitted. No acute fracture or subluxation. Small joint effusion. Infrapatellar soft tissue swelling. Again noted bipartite patella.  IMPRESSION: No acute fracture or subluxation. Small joint effusion. Infrapatellar soft tissue swelling.   Electronically Signed   By: Natasha MeadLiviu  Pop M.D.   On: 05/18/2014 10:26     EKG Interpretation None      MDM   Final diagnoses:  Right knee injury, initial encounter  Knee effusion, right   NAD. Neurovascularly intact distally. X-ray results as stated above. Moderate swelling noted on exam limiting assessment. Possible ligamentous injury versus dislocated patella that went back into place on its own. Knee immobilizer applied and crutches given. Discharged home with Rx Vicodin #15 tabs, follow-up with orthopedics. Stable for discharge. Return precautions given. Patient states understanding of treatment care plan and is agreeable.   Kathrynn SpeedRobyn M Adriane Guglielmo, PA-C 05/18/14 1047  Mirian MoMatthew Gentry, MD 05/19/14 [phone]

## 2014-05-18 NOTE — ED Notes (Signed)
Pt refused vital signs, said he was ready to leave

## 2014-05-18 NOTE — Discharge Instructions (Signed)
Take Vicodin for severe pain only. No driving or operating heavy machinery while taking vicodin. This medication may cause drowsiness. Rest, ice and elevate your knee. Keep the knee immobilizer on unless you are home sitting with ice on it. Sleep in the immobilizer as well. Follow up with orthopedics. You may have a torn ligament or meniscus in your knee, or it is possible you dislocated your patella and it went back into place.  Knee Effusion The medical term for having fluid in your knee is effusion. This is often due to an internal derangement of the knee. This means something is wrong inside the knee. Some of the causes of fluid in the knee may be torn cartilage, a torn ligament, or bleeding into the joint from an injury. Your knee is likely more difficult to bend and move. This is often because there is increased pain and pressure in the joint. The time it takes for recovery from a knee effusion depends on different factors, including:   Type of injury.  Your age.  Physical and medical conditions.  Rehabilitation Strategies. How long you will be away from your normal activities will depend on what kind of knee problem you have and how much damage is present. Your knee has two types of cartilage. Articular cartilage covers the bone ends and lets your knee bend and move smoothly. Two menisci, thick pads of cartilage that form a rim inside the joint, help absorb shock and stabilize your knee. Ligaments bind the bones together and support your knee joint. Muscles move the joint, help support your knee, and take stress off the joint itself. CAUSES  Often an effusion in the knee is caused by an injury to one of the menisci. This is often a tear in the cartilage. Recovery after a meniscus injury depends on how much meniscus is damaged and whether you have damaged other knee tissue. Small tears may heal on their own with conservative treatment. Conservative means rest, limited weight bearing activity and  muscle strengthening exercises. Your recovery may take up to 6 weeks.  TREATMENT  Larger tears may require surgery. Meniscus injuries may be treated during arthroscopy. Arthroscopy is a procedure in which your surgeon uses a small telescope like instrument to look in your knee. Your caregiver can make a more accurate diagnosis (learning what is wrong) by performing an arthroscopic procedure. If your injury is on the inner margin of the meniscus, your surgeon may trim the meniscus back to a smooth rim. In other cases your surgeon will try to repair a damaged meniscus with stitches (sutures). This may make rehabilitation take longer, but may provide better long term result by helping your knee keep its shock absorption capabilities. Ligaments which are completely torn usually require surgery for repair. HOME CARE INSTRUCTIONS  Use crutches as instructed.  If a brace is applied, use as directed.  Once you are home, an ice pack applied to your swollen knee may help with discomfort and help decrease swelling.  Keep your knee raised (elevated) when you are not up and around or on crutches.  Only take over-the-counter or prescription medicines for pain, discomfort, or fever as directed by your caregiver.  Your caregivers will help with instructions for rehabilitation of your knee. This often includes strengthening exercises.  You may resume a normal diet and activities as directed. SEEK MEDICAL CARE IF:   There is increased swelling in your knee.  You notice redness, swelling, or increasing pain in your knee.  An unexplained  oral temperature above 102 F (38.9 C) develops. SEEK IMMEDIATE MEDICAL CARE IF:   You develop a rash.  You have difficulty breathing.  You have any allergic reactions from medications you may have been given.  There is severe pain with any motion of the knee. MAKE SURE YOU:   Understand these instructions.  Will watch your condition.  Will get help right away  if you are not doing well or get worse. Document Released: 04/27/2003 Document Revised: 04/29/2011 Document Reviewed: 07/01/2007 [facility] Patient Information 2015 [place], [place]. This information is not intended to replace advice given to you by your health care provider. Make sure you discuss any questions you have with your health care provider.

## 2014-05-23 ENCOUNTER — Emergency Department (INDEPENDENT_AMBULATORY_CARE_PROVIDER_SITE_OTHER)
Admission: EM | Admit: 2014-05-23 | Discharge: 2014-05-23 | Disposition: A | Discharge status: Nursing Home (Includes ICF) | Payer: Self-pay | Source: Home / Self Care | Attending: Family Medicine | Admitting: Family Medicine

## 2014-05-23 ENCOUNTER — Emergency Department (HOSPITAL_COMMUNITY)
Admission: EM | Admit: 2014-05-23 | Discharge: 2014-05-24 | Disposition: A | Discharge status: Home / Self Care | Payer: Self-pay | Attending: Emergency Medicine | Admitting: Emergency Medicine

## 2014-05-23 ENCOUNTER — Emergency Department (HOSPITAL_COMMUNITY): Payer: Self-pay

## 2014-05-23 ENCOUNTER — Encounter (HOSPITAL_COMMUNITY): Payer: Self-pay | Admitting: *Deleted

## 2014-05-23 ENCOUNTER — Encounter (HOSPITAL_COMMUNITY): Payer: Self-pay

## 2014-05-23 DIAGNOSIS — Z720 Tobacco use: Secondary | ICD-10-CM | POA: Insufficient documentation

## 2014-05-23 DIAGNOSIS — Y998 Other external cause status: Secondary | ICD-10-CM | POA: Insufficient documentation

## 2014-05-23 DIAGNOSIS — M2391 Unspecified internal derangement of right knee: Secondary | ICD-10-CM

## 2014-05-23 DIAGNOSIS — M25561 Pain in right knee: Secondary | ICD-10-CM

## 2014-05-23 DIAGNOSIS — M1990 Unspecified osteoarthritis, unspecified site: Secondary | ICD-10-CM | POA: Insufficient documentation

## 2014-05-23 DIAGNOSIS — Z79899 Other long term (current) drug therapy: Secondary | ICD-10-CM | POA: Insufficient documentation

## 2014-05-23 DIAGNOSIS — M3210 Systemic lupus erythematosus, organ or system involvement unspecified: Secondary | ICD-10-CM | POA: Insufficient documentation

## 2014-05-23 DIAGNOSIS — Z792 Long term (current) use of antibiotics: Secondary | ICD-10-CM | POA: Insufficient documentation

## 2014-05-23 DIAGNOSIS — Y9367 Activity, basketball: Secondary | ICD-10-CM | POA: Insufficient documentation

## 2014-05-23 DIAGNOSIS — Y92310 Basketball court as the place of occurrence of the external cause: Secondary | ICD-10-CM | POA: Insufficient documentation

## 2014-05-23 DIAGNOSIS — I10 Essential (primary) hypertension: Secondary | ICD-10-CM | POA: Insufficient documentation

## 2014-05-23 DIAGNOSIS — Z7982 Long term (current) use of aspirin: Secondary | ICD-10-CM | POA: Insufficient documentation

## 2014-05-23 DIAGNOSIS — Z7952 Long term (current) use of systemic steroids: Secondary | ICD-10-CM | POA: Insufficient documentation

## 2014-05-23 DIAGNOSIS — X58XXXA Exposure to other specified factors, initial encounter: Secondary | ICD-10-CM | POA: Insufficient documentation

## 2014-05-23 DIAGNOSIS — S86811A Strain of other muscle(s) and tendon(s) at lower leg level, right leg, initial encounter: Secondary | ICD-10-CM | POA: Insufficient documentation

## 2014-05-23 MED ORDER — OXYCODONE-ACETAMINOPHEN 5-325 MG PO TABS
2.0000 | ORAL_TABLET | Freq: Once | ORAL | Status: AC
  Administered 2014-05-23: 2 via ORAL
  Filled 2014-05-23: qty 2
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
## 2014-05-23 NOTE — ED Notes (Signed)
Patient transported to MRI 

## 2014-05-23 NOTE — ED Notes (Signed)
Pt. Reports playing basketball yesterday and twisted right knee. Seen at [facility] for it.  Ray GarnerWen to urgent care today due to increase in pain and swelling. CNS intact distal to injury. Taking vicodin with no relief.

## 2014-05-23 NOTE — ED Notes (Signed)
PA said pt. Is unable to raise his lower leg  and thinks he may have ruptured his quadriceps tendon.  He needs f/u with an orthopedist.
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
## 2014-05-23 NOTE — ED Notes (Signed)
Recheck of knee injury.  He twisted it while playing baskeball.  Was seen and x[facility]ed at [facility] and told to f/u with orthopedist. Can't find name on chart.  Wearing knee immobilizer and using crutches.  States he does not have insurance to see specialist.

## 2014-05-24 ENCOUNTER — Ambulatory Visit: Payer: Self-pay | Attending: Family Medicine | Admitting: Family Medicine

## 2014-05-24 ENCOUNTER — Encounter: Payer: Self-pay | Admitting: Family Medicine

## 2014-05-24 VITALS — BP 130/94 | HR 71 | Temp 98.4°F | Resp 18 | Ht 65.0 in | Wt 196.2 lb

## 2014-05-24 DIAGNOSIS — F17210 Nicotine dependence, cigarettes, uncomplicated: Secondary | ICD-10-CM | POA: Insufficient documentation

## 2014-05-24 DIAGNOSIS — Z720 Tobacco use: Secondary | ICD-10-CM

## 2014-05-24 DIAGNOSIS — X58XXXA Exposure to other specified factors, initial encounter: Secondary | ICD-10-CM | POA: Insufficient documentation

## 2014-05-24 DIAGNOSIS — I10 Essential (primary) hypertension: Secondary | ICD-10-CM

## 2014-05-24 DIAGNOSIS — M329 Systemic lupus erythematosus, unspecified: Secondary | ICD-10-CM | POA: Insufficient documentation

## 2014-05-24 DIAGNOSIS — S86819A Strain of other muscle(s) and tendon(s) at lower leg level, unspecified leg, initial encounter: Secondary | ICD-10-CM | POA: Insufficient documentation

## 2014-05-24 DIAGNOSIS — S86811D Strain of other muscle(s) and tendon(s) at lower leg level, right leg, subsequent encounter: Secondary | ICD-10-CM

## 2014-05-24 DIAGNOSIS — IMO0002 Reserved for concepts with insufficient information to code with codable children: Secondary | ICD-10-CM

## 2014-05-24 DIAGNOSIS — F17200 Nicotine dependence, unspecified, uncomplicated: Secondary | ICD-10-CM

## 2014-05-24 DIAGNOSIS — S76111A Strain of right quadriceps muscle, fascia and tendon, initial encounter: Secondary | ICD-10-CM | POA: Insufficient documentation

## 2014-05-24 MED ORDER — LOSARTAN POTASSIUM-HCTZ 100-12.5 MG PO TABS: 1.0000 | ORAL_TABLET | Freq: Every day | ORAL | Status: DC

## 2014-05-24 MED ORDER — AMLODIPINE BESYLATE 5 MG PO TABS: 5.0000 mg | ORAL_TABLET | Freq: Every day | ORAL | Status: DC

## 2014-05-24 MED ORDER — OXYCODONE-ACETAMINOPHEN 5-325 MG PO TABS: 2.0000 | ORAL_TABLET | ORAL | Status: DC | PRN

## 2014-05-24 MED ORDER — TRAMADOL HCL 50 MG PO TABS: 50.0000 mg | ORAL_TABLET | Freq: Three times a day (TID) | ORAL | Status: DC | PRN

## 2014-05-24 NOTE — Progress Notes (Signed)
Patient here for hospital follow up. Patient in ED last night for right knee pain. Patient currently reports knee pain at level 10.  Patients BP 155/107 in left arm with machine. Patients BP 130/94 in left arm, manually.  Patient reports taking 1 med for blood pressure, prescribed 2 meds for BP but patient explains he only takes 1 and its a "little blue pill".

## 2014-05-24 NOTE — Discharge Instructions (Signed)
Patellar Tendon Tear / Disruption with Rehab A patellar tendon tear or disruption is a complete tear of the tendon below the kneecap. The patellar tendon attaches the thigh muscle (quadriceps) to the shinbone (tibia). These muscles are responsible for bending the knee and flexing the hip. A tear in the patellar tendon results in a disability to perform these actions. SYMPTOMS   A "pop" or tear felt in the knee or under the kneecap, at the time of injury.  Pain, tenderness, swelling, warmth, or redness over and around the patellar tendon.  Pain that gets worse when trying to forcefully straighten the knee or bend the knee.  Inability to straighten the knee when seated.  Crackling sound (crepitation) when the tendon is moved or touched.  Bruising (contusion) around the knee within 48 hours of injury.  Loss of firm fullness when pushing on the area where the tendon ruptured (a defect between the ends of the tendon where they separated from each other). CAUSES  The patellar tendon tears when a force is placed on it that is greater than it can handle. Common causes of injury include:  A stressful incident, such as with jumping, hurdling, or starting a sprint.  Direct hit (trauma) to the knee. RISK INCREASES WITH:  Sports that require sudden, explosive muscle contraction, such as those involving jumping or quick starts.  Running or contact sports.  Poor strength and flexibility.  Previous patellar tendon injury.  Untreated patellar tendinitis.  Corticosteroid injection into the patellar tendon. (Corticosteroid injections weaken tendons.) PREVENTION  Warm up and stretch properly before activity.  Allow for adequate recovery between workouts.  Maintain physical fitness:  Strength, flexibility, and endurance.  Cardiovascular fitness.  Protect the knee with taping, protective strapping, or elastic compression bandage during activity. PROGNOSIS  If treated properly, patellar  tendon tears usually heal, with a return to sports within 6 to 9 months after injury.  RELATED COMPLICATIONS   Weakness of the thigh (quadriceps) muscles, especially if the tear is left untreated.  Re-rupture of the tendon after treatment.  Prolonged disability.  Risks of surgery: infection, injury to nerves (numbness, weakness, or paralysis), bleeding, knee stiffness, knee weakness, pain when sitting for long periods, pain when getting up from a seated position and when kneeling or squatting, pain going up or down stairs or hills, and knee giving way or buckling. TREATMENT  Treatment first involves resting from any activities that aggravate the symptoms. The use of ice and medicine will help reduce pain and inflammation. Applying a compression bandage and elevating the knee above the level of the heart will also help reduce inflammation. Definitive treatment for patellar tendon tears is surgery, because contraction of the quadriceps tendon prevents healing of the tendon. Surgery often involves using stitches (sutures) to sew the ends of the tendon back together. Surgery is followed by restraint of the knee, to allow for healing. After restraint, it is important to perform strengthening and stretching exercises to help regain strength and a full range of motion. These exercises may be completed at home or with a therapist.  MEDICATION   If pain medicine is needed, nonsteroidal anti-inflammatory medicines (aspirin and ibuprofen), or other minor pain relievers (acetaminophen), are often advised.  Do not take pain medicine for 7 days before surgery.  Prescription pain relievers may be given, if your caregiver thinks they are needed. Use only as directed and only as much as you need. COLD THERAPY  Cold treatment (icing) should be applied for 10 to 15  minutes every 2 to 3 hours for inflammation and pain, and immediately after activity that aggravates your symptoms. Use ice packs or an ice  massage. SEEK MEDICAL CARE IF:  Pain increases, despite treatment.  Cast discomfort develops.  Any of the following occur after surgery: signs of infection, including fever, increased pain, swelling, redness, drainage of fluids, or bleeding in the affected area.  New, unexplained symptoms develop. (Drugs used in treatment may produce side effects.) EXERCISES RANGE OF MOTION (ROM) AND STRETCHING EXERCISES - Patellar Tendon Tear/Disruption These exercises may help you when beginning to rehabilitate your injury. Your symptoms may resolve with or without further involvement from your physician, physical therapist or athletic trainer. While completing these exercises, remember:   Restoring tissue flexibility helps normal motion to return to the joints. This allows healthier, less painful movement and activity.  An effective stretch should be held for at least 30 seconds.  A stretch should never be painful. You should only feel a gentle lengthening or release in the stretched tissue. RANGE OF MOTION - Knee Flexion and Extension, Active-Assisted  Sit on the edge of a table or chair with your thighs firmly supported. It may be helpful to place a folded towel under the end of your right / left thigh.  Flexion (bending): Place the ankle of your healthy leg on top of the other ankle. Use your healthy leg to gently bend your right / left knee until you feel a mild tension across the top of your knee.  Hold for __________ seconds.  Extension (straightening): Switch your ankles so your right / left leg is on top. Use your healthy leg to straighten your right / left knee until you feel a mild tension on the backside of your knee.  Hold for __________ seconds. Repeat __________ times. Complete this exercise __________ times per day. RANGE OF MOTION - Knee Flexion, Active  Lie on your back with both knees straight. (If this causes back discomfort, bend your healthy knee, placing your foot flat on the  floor.)  Slowly slide your heel back toward your buttocks until you feel a gentle stretch in the front of your knee or thigh.  Hold for __________ seconds. Slowly slide your heel back to the starting position. Repeat __________ times. Complete this exercise __________ times per day.  STRENGTHENING EXERCISES - Patellar Tendon Tear/Disruption  These exercises may help you when beginning to rehabilitate your injury. They may resolve your symptoms with or without further involvement from your physician, physical therapist or athletic trainer. While completing these exercises, remember:   Muscles can gain both the endurance and the strength needed for everyday activities through controlled exercises.  Complete these exercises as instructed by your physician, physical therapist or athletic trainer. Increase the resistance and repetitions only as guided by your caregiver. STRENGTH - Quadriceps, Isometrics  Lie on your back with your right / left leg extended and your opposite knee bent.  Gradually tense the muscles in the front of your right / left thigh. You should see either your kneecap slide up toward your hip or increased dimpling just above the knee. This motion will push the back of the knee down toward the floor, mat, or bed on which you are lying.  Hold the muscle as tight as you can without increasing your pain for __________ seconds.  Relax the muscles slowly and completely between each repetition. Repeat __________ times. Complete this exercise __________ times per day.  STRENGTH - Quadriceps, Straight Leg Raises Quality counts!  Watch for signs that the quadriceps muscle is working, to be sure you are strengthening the correct muscles and not "cheating" by substituting with healthier muscles. °· Lay on your back with your right / left leg extended and your opposite knee bent. °· Tense the muscles in the front of your right / left thigh. You should see either your kneecap slide up or  increased dimpling just above the knee. Your thigh may even shake a bit. °· Tighten these muscles even more and raise your leg 4 to 6 inches off the floor. Hold for __________ seconds. °· Keeping these muscles tense, lower your leg. °· Relax the muscles slowly and completely between each repetition. °Repeat __________ times. Complete this exercise __________ times per day.  °STRENGTH - Hip Abductors, Straight Leg Raises  °Be aware of your form throughout the entire exercise, so that you exercise the correct muscles Poor form means that you are not strengthening the correct muscles. °· Lie on your side so that your head, shoulders, knee and hip line up. You may bend your lower knee to help maintain your balance. Your right / left leg should be on top. °· Roll your hips slightly forward, so that your hips are stacked directly over each other and your right / left knee is facing forward. °· Lift your top leg up 4-6 inches, leading with your heel. Be sure that your foot does not drift forward or that your knee does not roll toward the ceiling. °· Hold this position for __________ seconds. You should feel the muscles in your outer hip lifting. (You may not notice this until your leg begins to tire.) °· Slowly lower your leg to the starting position. Allow the muscles to fully relax before beginning the next repetition. °Repeat __________ times. Complete this exercise __________ times per day.  °STRENGTH - Hip Extensors, Straight Leg Raises °· Lie on your stomach on a firm surface. °· Tense the muscles in your buttocks to lift your right / left leg about 4 inches. If you cannot lift your leg this high without arching your back, place a pillow under your hips. °· Keep your knee straight. Hold __________ seconds. °· Slowly lower your leg to the starting position and allow it to relax completely before starting the next repetition. °Repeat __________ times. Complete this exercise __________ times per day.  °STRENGTH - Hip  Adductors, Straight Leg Raises °· Lie on your side so that your head, shoulders, knee and hip line up. You may place your upper foot in front, to help maintain your balance. Your right / left leg should be on the bottom. °· Roll your hips slightly forward, so that your hips are stacked directly over each other and your right / left knee is facing forward. °· Tense the muscles in your inner thigh and lift your bottom leg 4-6 inches. Hold this position for __________ seconds. °· Slowly lower your leg to the starting position. Allow the muscles to fully relax before beginning the next repetition. °Repeat __________ times. Complete this exercise __________ times per day. °Document Released: 02/04/2005 Document Revised: 04/29/2011 Document Reviewed: 05/19/2008 °[employer]® Patient Information ©2015 [employer]. This information is not intended to replace advice given to you by your health care provider. Make sure you discuss any questions you have with your health care provider. ° °

## 2014-05-24 NOTE — Patient Instructions (Signed)
Patellar Tendon Tear / Disruption with Rehab A patellar tendon tear or disruption is a complete tear of the tendon below the kneecap. The patellar tendon attaches the thigh muscle (quadriceps) to the shinbone (tibia). These muscles are responsible for bending the knee and flexing the hip. A tear in the patellar tendon results in a disability to perform these actions. SYMPTOMS   A "pop" or tear felt in the knee or under the kneecap, at the time of injury.  Pain, tenderness, swelling, warmth, or redness over and around the patellar tendon.  Pain that gets worse when trying to forcefully straighten the knee or bend the knee.  Inability to straighten the knee when seated.  Crackling sound (crepitation) when the tendon is moved or touched.  Bruising (contusion) around the knee within 48 hours of injury.  Loss of firm fullness when pushing on the area where the tendon ruptured (a defect between the ends of the tendon where they separated from each other). CAUSES  The patellar tendon tears when a force is placed on it that is greater than it can handle. Common causes of injury include:  A stressful incident, such as with jumping, hurdling, or starting a sprint.  Direct hit (trauma) to the knee. RISK INCREASES WITH:  Sports that require sudden, explosive muscle contraction, such as those involving jumping or quick starts.  Running or contact sports.  Poor strength and flexibility.  Previous patellar tendon injury.  Untreated patellar tendinitis.  Corticosteroid injection into the patellar tendon. (Corticosteroid injections weaken tendons.) PREVENTION  Warm up and stretch properly before activity.  Allow for adequate recovery between workouts.  Maintain physical fitness:  Strength, flexibility, and endurance.  Cardiovascular fitness.  Protect the knee with taping, protective strapping, or elastic compression bandage during activity. PROGNOSIS  If treated properly, patellar  tendon tears usually heal, with a return to sports within 6 to 9 months after injury.  RELATED COMPLICATIONS   Weakness of the thigh (quadriceps) muscles, especially if the tear is left untreated.  Re-rupture of the tendon after treatment.  Prolonged disability.  Risks of surgery: infection, injury to nerves (numbness, weakness, or paralysis), bleeding, knee stiffness, knee weakness, pain when sitting for long periods, pain when getting up from a seated position and when kneeling or squatting, pain going up or down stairs or hills, and knee giving way or buckling. TREATMENT  Treatment first involves resting from any activities that aggravate the symptoms. The use of ice and medicine will help reduce pain and inflammation. Applying a compression bandage and elevating the knee above the level of the heart will also help reduce inflammation. Definitive treatment for patellar tendon tears is surgery, because contraction of the quadriceps tendon prevents healing of the tendon. Surgery often involves using stitches (sutures) to sew the ends of the tendon back together. Surgery is followed by restraint of the knee, to allow for healing. After restraint, it is important to perform strengthening and stretching exercises to help regain strength and a full range of motion. These exercises may be completed at home or with a therapist.  MEDICATION   If pain medicine is needed, nonsteroidal anti-inflammatory medicines (aspirin and ibuprofen), or other minor pain relievers (acetaminophen), are often advised.  Do not take pain medicine for 7 days before surgery.  Prescription pain relievers may be given, if your caregiver thinks they are needed. Use only as directed and only as much as you need. COLD THERAPY  Cold treatment (icing) should be applied for 10 to 15  minutes every 2 to 3 hours for inflammation and pain, and immediately after activity that aggravates your symptoms. Use ice packs or an ice  massage. SEEK MEDICAL CARE IF:  Pain increases, despite treatment.  Cast discomfort develops.  Any of the following occur after surgery: signs of infection, including fever, increased pain, swelling, redness, drainage of fluids, or bleeding in the affected area.  New, unexplained symptoms develop. (Drugs used in treatment may produce side effects.) EXERCISES RANGE OF MOTION (ROM) AND STRETCHING EXERCISES - Patellar Tendon Tear/Disruption These exercises may help you when beginning to rehabilitate your injury. Your symptoms may resolve with or without further involvement from your physician, physical therapist or athletic trainer. While completing these exercises, remember:   Restoring tissue flexibility helps normal motion to return to the joints. This allows healthier, less painful movement and activity.  An effective stretch should be held for at least 30 seconds.  A stretch should never be painful. You should only feel a gentle lengthening or release in the stretched tissue. RANGE OF MOTION - Knee Flexion and Extension, Active-Assisted  Sit on the edge of a table or chair with your thighs firmly supported. It may be helpful to place a folded towel under the end of your right / left thigh.  Flexion (bending): Place the ankle of your healthy leg on top of the other ankle. Use your healthy leg to gently bend your right / left knee until you feel a mild tension across the top of your knee.  Hold for __________ seconds.  Extension (straightening): Switch your ankles so your right / left leg is on top. Use your healthy leg to straighten your right / left knee until you feel a mild tension on the backside of your knee.  Hold for __________ seconds. Repeat __________ times. Complete this exercise __________ times per day. RANGE OF MOTION - Knee Flexion, Active  Lie on your back with both knees straight. (If this causes back discomfort, bend your healthy knee, placing your foot flat on the  floor.)  Slowly slide your heel back toward your buttocks until you feel a gentle stretch in the front of your knee or thigh.  Hold for __________ seconds. Slowly slide your heel back to the starting position. Repeat __________ times. Complete this exercise __________ times per day.  STRENGTHENING EXERCISES - Patellar Tendon Tear/Disruption  These exercises may help you when beginning to rehabilitate your injury. They may resolve your symptoms with or without further involvement from your physician, physical therapist or athletic trainer. While completing these exercises, remember:   Muscles can gain both the endurance and the strength needed for everyday activities through controlled exercises.  Complete these exercises as instructed by your physician, physical therapist or athletic trainer. Increase the resistance and repetitions only as guided by your caregiver. STRENGTH - Quadriceps, Isometrics  Lie on your back with your right / left leg extended and your opposite knee bent.  Gradually tense the muscles in the front of your right / left thigh. You should see either your kneecap slide up toward your hip or increased dimpling just above the knee. This motion will push the back of the knee down toward the floor, mat, or bed on which you are lying.  Hold the muscle as tight as you can without increasing your pain for __________ seconds.  Relax the muscles slowly and completely between each repetition. Repeat __________ times. Complete this exercise __________ times per day.  STRENGTH - Quadriceps, Straight Leg Raises Quality counts!   Watch for signs that the quadriceps muscle is working, to be sure you are strengthening the correct muscles and not "cheating" by substituting with healthier muscles. °· Lay on your back with your right / left leg extended and your opposite knee bent. °· Tense the muscles in the front of your right / left thigh. You should see either your kneecap slide up or  increased dimpling just above the knee. Your thigh may even shake a bit. °· Tighten these muscles even more and raise your leg 4 to 6 inches off the floor. Hold for __________ seconds. °· Keeping these muscles tense, lower your leg. °· Relax the muscles slowly and completely between each repetition. °Repeat __________ times. Complete this exercise __________ times per day.  °STRENGTH - Hip Abductors, Straight Leg Raises  °Be aware of your form throughout the entire exercise, so that you exercise the correct muscles Poor form means that you are not strengthening the correct muscles. °· Lie on your side so that your head, shoulders, knee and hip line up. You may bend your lower knee to help maintain your balance. Your right / left leg should be on top. °· Roll your hips slightly forward, so that your hips are stacked directly over each other and your right / left knee is facing forward. °· Lift your top leg up 4-6 inches, leading with your heel. Be sure that your foot does not drift forward or that your knee does not roll toward the ceiling. °· Hold this position for __________ seconds. You should feel the muscles in your outer hip lifting. (You may not notice this until your leg begins to tire.) °· Slowly lower your leg to the starting position. Allow the muscles to fully relax before beginning the next repetition. °Repeat __________ times. Complete this exercise __________ times per day.  °STRENGTH - Hip Extensors, Straight Leg Raises °· Lie on your stomach on a firm surface. °· Tense the muscles in your buttocks to lift your right / left leg about 4 inches. If you cannot lift your leg this high without arching your back, place a pillow under your hips. °· Keep your knee straight. Hold __________ seconds. °· Slowly lower your leg to the starting position and allow it to relax completely before starting the next repetition. °Repeat __________ times. Complete this exercise __________ times per day.  °STRENGTH - Hip  Adductors, Straight Leg Raises °· Lie on your side so that your head, shoulders, knee and hip line up. You may place your upper foot in front, to help maintain your balance. Your right / left leg should be on the bottom. °· Roll your hips slightly forward, so that your hips are stacked directly over each other and your right / left knee is facing forward. °· Tense the muscles in your inner thigh and lift your bottom leg 4-6 inches. Hold this position for __________ seconds. °· Slowly lower your leg to the starting position. Allow the muscles to fully relax before beginning the next repetition. °Repeat __________ times. Complete this exercise __________ times per day. °Document Released: 02/04/2005 Document Revised: 04/29/2011 Document Reviewed: 05/19/2008 °[employer]® Patient Information ©2015 [employer]. This information is not intended to replace advice given to you by your health care provider. Make sure you discuss any questions you have with your health care provider. ° °
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
## 2014-06-22 ENCOUNTER — Ambulatory Visit: Payer: Self-pay

## 2014-10-14 ENCOUNTER — Other Ambulatory Visit: Payer: Self-pay | Admitting: Family Medicine

## 2014-10-17 ENCOUNTER — Ambulatory Visit (INDEPENDENT_AMBULATORY_CARE_PROVIDER_SITE_OTHER): Payer: Self-pay | Admitting: Family Medicine

## 2014-10-17 VITALS — BP 168/112 | HR 72 | Temp 98.2°F | Resp 16 | Ht 65.0 in | Wt 195.4 lb

## 2014-10-17 DIAGNOSIS — I10 Essential (primary) hypertension: Secondary | ICD-10-CM

## 2014-10-17 DIAGNOSIS — IMO0002 Reserved for concepts with insufficient information to code with codable children: Secondary | ICD-10-CM

## 2014-10-17 DIAGNOSIS — M329 Systemic lupus erythematosus, unspecified: Secondary | ICD-10-CM

## 2014-10-17 MED ORDER — TRIAMCINOLONE ACETONIDE 0.1 % EX CREA: TOPICAL_CREAM | CUTANEOUS | Status: DC

## 2014-10-17 MED ORDER — AMLODIPINE BESYLATE 5 MG PO TABS: 5.0000 mg | ORAL_TABLET | Freq: Every day | ORAL | Status: DC

## 2014-10-17 MED ORDER — CLONIDINE HCL 0.1 MG PO TABS: 0.1000 mg | ORAL_TABLET | Freq: Two times a day (BID) | ORAL | Status: DC

## 2014-10-17 MED ORDER — PREDNISONE 20 MG PO TABS: 40.0000 mg | ORAL_TABLET | Freq: Every day | ORAL | Status: DC

## 2014-10-17 NOTE — Progress Notes (Signed)
   Subjective:    Patient ID: Ray Davis, male    DOB: [birth year], 39 y.o.   MRN: [id number] This chart was scribed for Ray Sidle, MD by Littie Deeds, Medical Scribe. This patient was seen in Room 11 and the patient's care was started at 5:31 PM.   HPI HPI Comments: Ray Davis is a 39 y.o. male with a history of lupus and hypertension who presents to the [facility] for a follow-up. Patient notes that he has still has generalized arthralgias in all of his joints due to his lupus. He stopped working due to the pain and has been trying to get disabilities, but he has still not heard back. Patient also reports having a headache. He has lost his health insurance.  Patient has been with his fiance for about 2 years.  Review of Systems  Musculoskeletal: Positive for arthralgias.  Neurological: Positive for headaches.       Objective:   Physical Exam CONSTITUTIONAL: Well developed/well nourished HEAD: Normocephalic/atraumatic EYES: EOM/PERRL ENMT: Mucous membranes moist NECK: supple no meningeal signs SPINE: entire spine nontender CV: S1/S2 noted, no murmurs/rubs/gallops noted LUNGS: Lungs are clear to auscultation bilaterally, no apparent distress ABDOMEN: soft, nontender, no rebound or guarding GU: no cva tenderness NEURO: Pt is awake/alert, moves all extremitiesx4 EXTREMITIES: pulses normal SKIN: warm, hypopigmentation and scarring over the bridge of his nose and left jaw. PSYCH: no abnormalities of mood noted     Assessment & Plan:  Patient is pursuing primary care that he can afford.  This chart was scribed in my presence and reviewed by me personally.    ICD-9-CM ICD-10-CM   1. Systemic lupus 710.0 M32.9 triamcinolone cream (KENALOG) 0.1 %  2. Essential hypertension 401.9 I10 cloNIDine (CATAPRES) 0.1 MG tablet  3. Lupus 710.0 M32.9 amLODipine (NORVASC) 5 MG tablet     predniSONE (DELTASONE) 20 MG tablet     triamcinolone cream (KENALOG) 0.1 %    I wrote a letter to support his disability appeal.  Signed, Ray Sidle, MD \

## 2014-10-17 NOTE — Telephone Encounter (Signed)
Dr L, I see pt is coming in for appt this afternoon. Please review this med w/pt and RF is appropriate.

## 2014-11-07 ENCOUNTER — Encounter (HOSPITAL_COMMUNITY): Payer: Self-pay | Admitting: Emergency Medicine

## 2014-11-07 ENCOUNTER — Inpatient Hospital Stay (HOSPITAL_COMMUNITY)
Admission: EM | Admit: 2014-11-07 | Discharge: 2014-11-11 | DRG: 982 | Disposition: A | Discharge status: Home / Self Care | Payer: Self-pay | Attending: Internal Medicine | Admitting: Internal Medicine

## 2014-11-07 DIAGNOSIS — I160 Hypertensive urgency: Secondary | ICD-10-CM | POA: Diagnosis present

## 2014-11-07 DIAGNOSIS — E872 Acidosis: Secondary | ICD-10-CM | POA: Diagnosis present

## 2014-11-07 DIAGNOSIS — R0902 Hypoxemia: Secondary | ICD-10-CM | POA: Diagnosis not present

## 2014-11-07 DIAGNOSIS — F17210 Nicotine dependence, cigarettes, uncomplicated: Secondary | ICD-10-CM | POA: Diagnosis present

## 2014-11-07 DIAGNOSIS — I10 Essential (primary) hypertension: Secondary | ICD-10-CM | POA: Diagnosis present

## 2014-11-07 DIAGNOSIS — Z9114 Patient's other noncompliance with medication regimen: Secondary | ICD-10-CM | POA: Diagnosis present

## 2014-11-07 DIAGNOSIS — Z7982 Long term (current) use of aspirin: Secondary | ICD-10-CM

## 2014-11-07 DIAGNOSIS — Z7952 Long term (current) use of systemic steroids: Secondary | ICD-10-CM

## 2014-11-07 DIAGNOSIS — Z79899 Other long term (current) drug therapy: Secondary | ICD-10-CM

## 2014-11-07 DIAGNOSIS — D62 Acute posthemorrhagic anemia: Secondary | ICD-10-CM | POA: Diagnosis present

## 2014-11-07 DIAGNOSIS — R739 Hyperglycemia, unspecified: Secondary | ICD-10-CM | POA: Diagnosis present

## 2014-11-07 DIAGNOSIS — L930 Discoid lupus erythematosus: Secondary | ICD-10-CM | POA: Diagnosis present

## 2014-11-07 DIAGNOSIS — R040 Epistaxis: Principal | ICD-10-CM | POA: Diagnosis present

## 2014-11-07 DIAGNOSIS — T380X5A Adverse effect of glucocorticoids and synthetic analogues, initial encounter: Secondary | ICD-10-CM | POA: Diagnosis present

## 2014-11-07 HISTORY — DX: Epistaxis: R04.0

## 2014-11-07 LAB — CBC WITH DIFFERENTIAL/PLATELET
Basophils Absolute: 0 10*3/uL (ref 0.0–0.1)
Basophils Relative: 0 %
EOS ABS: 0 10*3/uL (ref 0.0–0.7)
EOS PCT: 1 %
HCT: 37.4 % — ABNORMAL LOW (ref 39.0–52.0)
Hemoglobin: 13 g/dL (ref 13.0–17.0)
Lymphocytes Relative: 43 %
Lymphs Abs: 1.6 10*3/uL (ref 0.7–4.0)
MCH: 29 pg (ref 26.0–34.0)
MCHC: 34.8 g/dL (ref 30.0–36.0)
MCV: 83.5 fL (ref 78.0–100.0)
Monocytes Absolute: 0.3 10*3/uL (ref 0.1–1.0)
Monocytes Relative: 8 %
Neutro Abs: 1.8 10*3/uL (ref 1.7–7.7)
Neutrophils Relative %: 48 %
PLATELETS: 249 10*3/uL (ref 150–400)
RBC: 4.48 MIL/uL (ref 4.22–5.81)
RDW: 13.2 % (ref 11.5–15.5)
WBC: 3.8 10*3/uL — AB (ref 4.0–10.5)

## 2014-11-07 LAB — BASIC METABOLIC PANEL
Anion gap: 13 (ref 5–15)
BUN: 13 mg/dL (ref 6–20)
CALCIUM: 8.7 mg/dL — AB (ref 8.9–10.3)
CO2: 16 mmol/L — ABNORMAL LOW (ref 22–32)
CREATININE: 0.95 mg/dL (ref 0.61–1.24)
Chloride: 103 mmol/L (ref 101–111)
GFR calc Af Amer: >60 mL/min (ref >60)
Glucose, Bld: 123 mg/dL — ABNORMAL HIGH (ref 65–99)
Potassium: 3.2 mmol/L — ABNORMAL LOW (ref 3.5–5.1)
SODIUM: 132 mmol/L — AB (ref 135–145)

## 2014-11-07 LAB — PROTIME-INR
INR: 1.09 (ref 0.00–1.49)
Prothrombin Time: 14.3 seconds (ref 11.6–15.2)

## 2014-11-07 NOTE — ED Notes (Signed)
EDP at bedside, plan is to consult ENT

## 2014-11-07 NOTE — ED Notes (Signed)
Dr. Blinda Leatherwood and resident at bedside.
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
## 2014-11-07 NOTE — ED Notes (Signed)
2 Rhino Rockets placed by Pollina MD. Waiting for response from ENT

## 2014-11-07 NOTE — ED Notes (Signed)
Pt family states that last time pt had nose bleeds, pt had to have surgery due to not being able to stop the bleeding. Nurse first RN aware.

## 2014-11-07 NOTE — ED Notes (Signed)
Pt from home for eval of epistaxis x1 hour, pt states he has tried to apply pressure with no relief. Pt states he hs been taking HTN meds and recently has started to drink alcohol again. Denies any pain, denies taking any blood thinners. Minor bleeding from both nares noted, and blood noted on back of throa.

## 2014-11-08 ENCOUNTER — Inpatient Hospital Stay (HOSPITAL_COMMUNITY): Payer: Self-pay | Admitting: Anesthesiology

## 2014-11-08 ENCOUNTER — Inpatient Hospital Stay (HOSPITAL_COMMUNITY): Payer: Self-pay

## 2014-11-08 ENCOUNTER — Encounter (HOSPITAL_COMMUNITY): Payer: Self-pay | Admitting: Internal Medicine

## 2014-11-08 ENCOUNTER — Encounter (HOSPITAL_COMMUNITY)
Admission: EM | Disposition: A | Discharge status: Home / Self Care | Payer: Self-pay | Source: Home / Self Care | Attending: Internal Medicine

## 2014-11-08 DIAGNOSIS — I160 Hypertensive urgency: Secondary | ICD-10-CM | POA: Diagnosis present

## 2014-11-08 DIAGNOSIS — I10 Essential (primary) hypertension: Secondary | ICD-10-CM

## 2014-11-08 DIAGNOSIS — L930 Discoid lupus erythematosus: Secondary | ICD-10-CM

## 2014-11-08 DIAGNOSIS — R040 Epistaxis: Secondary | ICD-10-CM | POA: Insufficient documentation

## 2014-11-08 HISTORY — PX: RADIOLOGY WITH ANESTHESIA: SHX6223

## 2014-11-08 LAB — COMPREHENSIVE METABOLIC PANEL
ALT: 53 U/L (ref 17–63)
ANION GAP: 9 (ref 5–15)
AST: 42 U/L — ABNORMAL HIGH (ref 15–41)
Albumin: 3.4 g/dL — ABNORMAL LOW (ref 3.5–5.0)
Alkaline Phosphatase: 66 U/L (ref 38–126)
BILIRUBIN TOTAL: 0.9 mg/dL (ref 0.3–1.2)
BUN: 19 mg/dL (ref 6–20)
CALCIUM: 8.8 mg/dL — AB (ref 8.9–10.3)
CO2: 21 mmol/L — ABNORMAL LOW (ref 22–32)
Chloride: 103 mmol/L (ref 101–111)
Creatinine, Ser: 0.96 mg/dL (ref 0.61–1.24)
GFR calc Af Amer: >60 mL/min (ref >60)
Glucose, Bld: 130 mg/dL — ABNORMAL HIGH (ref 65–99)
POTASSIUM: 3.8 mmol/L (ref 3.5–5.1)
Sodium: 133 mmol/L — ABNORMAL LOW (ref 135–145)
TOTAL PROTEIN: 7 g/dL (ref 6.5–8.1)

## 2014-11-08 LAB — CBC WITH DIFFERENTIAL/PLATELET
Basophils Absolute: 0 10*3/uL (ref 0.0–0.1)
Basophils Relative: 0 %
EOS ABS: 0 10*3/uL (ref 0.0–0.7)
Eosinophils Relative: 0 %
HEMATOCRIT: 36.4 % — AB (ref 39.0–52.0)
HEMOGLOBIN: 12.5 g/dL — AB (ref 13.0–17.0)
LYMPHS ABS: 1.3 10*3/uL (ref 0.7–4.0)
Lymphocytes Relative: 18 %
MCH: 28.7 pg (ref 26.0–34.0)
MCHC: 34.3 g/dL (ref 30.0–36.0)
MCV: 83.5 fL (ref 78.0–100.0)
MONO ABS: 0.5 10*3/uL (ref 0.1–1.0)
MONOS PCT: 7 %
NEUTROS PCT: 75 %
Neutro Abs: 5.3 10*3/uL (ref 1.7–7.7)
Platelets: 236 10*3/uL (ref 150–400)
RBC: 4.36 MIL/uL (ref 4.22–5.81)
RDW: 13.2 % (ref 11.5–15.5)
WBC: 7.1 10*3/uL (ref 4.0–10.5)

## 2014-11-08 LAB — RAPID URINE DRUG SCREEN, HOSP PERFORMED
AMPHETAMINES: NOT DETECTED
BARBITURATES: NOT DETECTED
BENZODIAZEPINES: NOT DETECTED
COCAINE: NOT DETECTED
Opiates: POSITIVE — AB
TETRAHYDROCANNABINOL: NOT DETECTED

## 2014-11-08 LAB — TYPE AND SCREEN
ABO/RH(D): A POS
Antibody Screen: NEGATIVE

## 2014-11-08 LAB — ABO/RH: ABO/RH(D): A POS

## 2014-11-08 LAB — POCT ACTIVATED CLOTTING TIME: ACTIVATED CLOTTING TIME: 159 s

## 2014-11-08 SURGERY — RADIOLOGY WITH ANESTHESIA
Anesthesia: General

## 2014-11-08 MED ORDER — SILVER NITRATE-POT NITRATE 75-25 % EX MISC
1.0000 | Freq: Once | CUTANEOUS | Status: DC | PRN
  Filled 2014-11-08: qty 1

## 2014-11-08 MED ORDER — FENTANYL CITRATE (PF) 100 MCG/2ML IJ SOLN: 25.0000 ug | INTRAMUSCULAR | Status: DC | PRN

## 2014-11-08 MED ORDER — ACETAMINOPHEN 650 MG RE SUPP
650.0000 mg | Freq: Four times a day (QID) | RECTAL | Status: DC | PRN
Start: 2014-11-08 — End: 2014-11-08

## 2014-11-08 MED ORDER — PROMETHAZINE HCL 25 MG/ML IJ SOLN: 6.2500 mg | INTRAMUSCULAR | Status: DC | PRN

## 2014-11-08 MED ORDER — CETYLPYRIDINIUM CHLORIDE 0.05 % MT LIQD
7.0000 mL | Freq: Two times a day (BID) | OROMUCOSAL | Status: DC
  Administered 2014-11-08 – 2014-11-09 (×2): 7 mL via OROMUCOSAL

## 2014-11-08 MED ORDER — GLYCOPYRROLATE 0.2 MG/ML IJ SOLN
INTRAMUSCULAR | Status: DC | PRN
  Administered 2014-11-08: 0.4 mg via INTRAVENOUS

## 2014-11-08 MED ORDER — METHYLPREDNISOLONE SODIUM SUCC 125 MG IJ SOLR
40.0000 mg | Freq: Every day | INTRAMUSCULAR | Status: DC
  Administered 2014-11-08: 125 mg via INTRAVENOUS
  Administered 2014-11-09 – 2014-11-11 (×3): 40 mg via INTRAVENOUS
  Filled 2014-11-08 (×2): qty 2
  Filled 2014-11-08: qty 0.64
  Filled 2014-11-08: qty 2

## 2014-11-08 MED ORDER — OXYMETAZOLINE HCL 0.05 % NA SOLN
1.0000 | Freq: Once | NASAL | Status: DC | PRN
  Filled 2014-11-08: qty 15

## 2014-11-08 MED ORDER — LACTATED RINGERS IV SOLN
INTRAVENOUS | Status: DC
  Administered 2014-11-08 (×2): via INTRAVENOUS

## 2014-11-08 MED ORDER — SUCCINYLCHOLINE CHLORIDE 20 MG/ML IJ SOLN
INTRAMUSCULAR | Status: DC | PRN
  Administered 2014-11-08: 160 mg via INTRAVENOUS

## 2014-11-08 MED ORDER — ONDANSETRON HCL 4 MG/2ML IJ SOLN
4.0000 mg | Freq: Four times a day (QID) | INTRAMUSCULAR | Status: DC | PRN
  Administered 2014-11-08: 4 mg via INTRAVENOUS

## 2014-11-08 MED ORDER — SODIUM CHLORIDE 0.9 % IV SOLN
INTRAVENOUS | Status: DC
  Administered 2014-11-09 – 2014-11-10 (×2): via INTRAVENOUS

## 2014-11-08 MED ORDER — LIDOCAINE HCL 2 % EX GEL
1.0000 "application " | Freq: Once | CUTANEOUS | Status: DC | PRN
  Filled 2014-11-08: qty 5

## 2014-11-08 MED ORDER — PROPOFOL 10 MG/ML IV BOLUS
INTRAVENOUS | Status: DC | PRN
  Administered 2014-11-08: 200 mg via INTRAVENOUS

## 2014-11-08 MED ORDER — LACTATED RINGERS IV SOLN
INTRAVENOUS | Status: DC
  Administered 2014-11-08: 18:00:00 via INTRAVENOUS

## 2014-11-08 MED ORDER — PNEUMOCOCCAL VAC POLYVALENT 25 MCG/0.5ML IJ INJ
0.5000 mL | INJECTION | INTRAMUSCULAR | Status: AC
  Administered 2014-11-09: 0.5 mL via INTRAMUSCULAR
  Filled 2014-11-08: qty 0.5

## 2014-11-08 MED ORDER — INFLUENZA VAC SPLIT QUAD 0.5 ML IM SUSY
0.5000 mL | PREFILLED_SYRINGE | INTRAMUSCULAR | Status: AC
  Administered 2014-11-09: 0.5 mL via INTRAMUSCULAR
  Filled 2014-11-08: qty 0.5

## 2014-11-08 MED ORDER — NICARDIPINE HCL IN NACL 20-0.86 MG/200ML-% IV SOLN
5.0000 mg/h | INTRAVENOUS | Status: DC
Start: 2014-11-08 — End: 2014-11-10

## 2014-11-08 MED ORDER — FENTANYL CITRATE (PF) 100 MCG/2ML IJ SOLN
INTRAMUSCULAR | Status: DC | PRN
  Administered 2014-11-08: 100 ug via INTRAVENOUS
  Administered 2014-11-08 (×2): 150 ug via INTRAVENOUS
  Administered 2014-11-08 (×2): 50 ug via INTRAVENOUS

## 2014-11-08 MED ORDER — HEPARIN SODIUM (PORCINE) 1000 UNIT/ML IJ SOLN
INTRAMUSCULAR | Status: DC | PRN
  Administered 2014-11-08: 500 [IU] via INTRAVENOUS
  Administered 2014-11-08: 3000 [IU] via INTRAVENOUS
  Administered 2014-11-08: 500 [IU] via INTRAVENOUS

## 2014-11-08 MED ORDER — ACETAMINOPHEN 325 MG PO TABS: 650.0000 mg | ORAL_TABLET | Freq: Four times a day (QID) | ORAL | Status: DC | PRN

## 2014-11-08 MED ORDER — ACETAMINOPHEN 650 MG RE SUPP: 650.0000 mg | Freq: Four times a day (QID) | RECTAL | Status: DC | PRN

## 2014-11-08 MED ORDER — PANTOPRAZOLE SODIUM 40 MG IV SOLR
40.0000 mg | INTRAVENOUS | Status: DC
  Administered 2014-11-09 – 2014-11-11 (×3): 40 mg via INTRAVENOUS
  Filled 2014-11-08 (×3): qty 40

## 2014-11-08 MED ORDER — LIDOCAINE HCL (CARDIAC) 20 MG/ML IV SOLN
INTRAVENOUS | Status: DC | PRN
  Administered 2014-11-08: 100 mg via INTRAVENOUS

## 2014-11-08 MED ORDER — LABETALOL HCL 5 MG/ML IV SOLN
INTRAVENOUS | Status: DC | PRN
  Administered 2014-11-08 (×4): 10 mg via INTRAVENOUS

## 2014-11-08 MED ORDER — PHENYLEPHRINE HCL 10 MG/ML IJ SOLN
10.0000 mg | INTRAVENOUS | Status: DC | PRN
  Administered 2014-11-08: 25 ug/min via INTRAVENOUS

## 2014-11-08 MED ORDER — MORPHINE SULFATE (PF) 2 MG/ML IV SOLN
1.0000 mg | INTRAVENOUS | Status: DC | PRN
  Administered 2014-11-08 (×2): 1 mg via INTRAVENOUS
  Filled 2014-11-08 (×3): qty 1

## 2014-11-08 MED ORDER — MEPERIDINE HCL 25 MG/ML IJ SOLN: 6.2500 mg | INTRAMUSCULAR | Status: DC | PRN

## 2014-11-08 MED ORDER — PHENYLEPHRINE HCL 10 MG/ML IJ SOLN
INTRAMUSCULAR | Status: DC | PRN
  Administered 2014-11-08 (×2): 80 ug via INTRAVENOUS

## 2014-11-08 MED ORDER — HYDRALAZINE HCL 20 MG/ML IJ SOLN
10.0000 mg | Freq: Once | INTRAMUSCULAR | Status: AC
  Administered 2014-11-08: 10 mg via INTRAVENOUS
  Filled 2014-11-08: qty 1

## 2014-11-08 MED ORDER — ROCURONIUM BROMIDE 100 MG/10ML IV SOLN
INTRAVENOUS | Status: DC | PRN
  Administered 2014-11-08: 30 mg via INTRAVENOUS

## 2014-11-08 MED ORDER — IOHEXOL 300 MG/ML  SOLN
400.0000 mL | Freq: Once | INTRAMUSCULAR | Status: DC | PRN
  Administered 2014-11-08: 275 mL via INTRA_ARTERIAL
  Filled 2014-11-08: qty 400

## 2014-11-08 MED ORDER — LIDOCAINE HCL 4 % EX SOLN
0.0000 mL | Freq: Once | CUTANEOUS | Status: DC | PRN
  Filled 2014-11-08: qty 50

## 2014-11-08 MED ORDER — MORPHINE SULFATE (PF) 2 MG/ML IV SOLN
1.0000 mg | INTRAVENOUS | Status: DC | PRN
  Administered 2014-11-08 – 2014-11-11 (×10): 1 mg via INTRAVENOUS
  Filled 2014-11-08 (×10): qty 1

## 2014-11-08 MED ORDER — HYDRALAZINE HCL 20 MG/ML IJ SOLN
10.0000 mg | INTRAMUSCULAR | Status: DC | PRN
  Administered 2014-11-08: 5 mg via INTRAVENOUS
  Administered 2014-11-08 (×2): 10 mg via INTRAVENOUS
  Filled 2014-11-08 (×2): qty 1

## 2014-11-08 MED ORDER — NITROGLYCERIN 1 MG/10 ML FOR IR/CATH LAB
INTRA_ARTERIAL | Status: AC
Start: 2014-11-08 — End: 2014-11-08
  Administered 2014-11-08 (×3): 25 ug
  Filled 2014-11-08: qty 10

## 2014-11-08 MED ORDER — NEOSTIGMINE METHYLSULFATE 10 MG/10ML IV SOLN
INTRAVENOUS | Status: DC | PRN
  Administered 2014-11-08: 3 mg via INTRAVENOUS

## 2014-11-08 MED ORDER — TRIPLE ANTIBIOTIC 3.5-400-5000 EX OINT
1.0000 "application " | TOPICAL_OINTMENT | Freq: Once | CUTANEOUS | Status: DC | PRN
  Filled 2014-11-08: qty 1

## 2014-11-08 MED ORDER — SODIUM CHLORIDE 0.9 % IV SOLN
INTRAVENOUS | Status: DC
  Administered 2014-11-08: 1000 mL via INTRAVENOUS

## 2014-11-08 MED ORDER — SULFAMETHOXAZOLE-TRIMETHOPRIM 800-160 MG PO TABS
1.0000 | ORAL_TABLET | Freq: Two times a day (BID) | ORAL | Status: DC
  Administered 2014-11-08: 1 via ORAL
  Filled 2014-11-08: qty 1

## 2014-11-08 MED ORDER — AMPICILLIN-SULBACTAM SODIUM 1.5 (1-0.5) G IJ SOLR
1.5000 g | Freq: Four times a day (QID) | INTRAMUSCULAR | Status: DC
  Administered 2014-11-08 – 2014-11-11 (×13): 1.5 g via INTRAVENOUS
  Filled 2014-11-08 (×17): qty 1.5

## 2014-11-08 MED ORDER — MIDAZOLAM HCL 5 MG/5ML IJ SOLN
INTRAMUSCULAR | Status: DC | PRN
  Administered 2014-11-08 (×2): 1 mg via INTRAVENOUS

## 2014-11-08 MED ORDER — CLONIDINE HCL 0.1 MG/24HR TD PTWK
0.1000 mg | MEDICATED_PATCH | TRANSDERMAL | Status: DC
  Administered 2014-11-08: 0.1 mg via TRANSDERMAL
  Filled 2014-11-08: qty 1

## 2014-11-08 MED ORDER — ONDANSETRON HCL 4 MG PO TABS: 4.0000 mg | ORAL_TABLET | Freq: Four times a day (QID) | ORAL | Status: DC | PRN

## 2014-11-08 MED ORDER — LIDOCAINE-EPINEPHRINE (PF) 1 %-1:200000 IJ SOLN
0.0000 mL | Freq: Once | INTRAMUSCULAR | Status: DC | PRN
  Filled 2014-11-08: qty 30

## 2014-11-08 MED ORDER — ONDANSETRON HCL 4 MG/2ML IJ SOLN: 4.0000 mg | Freq: Four times a day (QID) | INTRAMUSCULAR | Status: DC | PRN

## 2014-11-08 MED ORDER — ACETAMINOPHEN 500 MG PO TABS
1000.0000 mg | ORAL_TABLET | Freq: Four times a day (QID) | ORAL | Status: DC | PRN
  Administered 2014-11-08 – 2014-11-11 (×4): 1000 mg via ORAL
  Filled 2014-11-08 (×4): qty 2

## 2014-11-08 MED ORDER — CEFAZOLIN SODIUM-DEXTROSE 2-3 GM-% IV SOLR
INTRAVENOUS | Status: AC
  Filled 2014-11-08: qty 50

## 2014-11-08 NOTE — Transfer of Care (Signed)
Immediate Anesthesia Transfer of Care Note  Patient: Ray Davis  Procedure(s) Performed: Procedure(s): RADIOLOGY WITH ANESTHESIA (N/A)  Patient Location: PACU  Anesthesia Type:General  Level of Consciousness: awake, alert , oriented and patient cooperative  Airway & Oxygen Therapy: Patient Spontanous Breathing and Patient connected to face mask oxygen  Post-op Assessment: Report given to RN, Post -op Vital signs reviewed and stable and Patient moving all extremities  Post vital signs: Reviewed and stable  Last Vitals:  Filed Vitals:   11/08/14 0845  BP: 143/100  Pulse: 101  Temp:   Resp: 22    Complications: No apparent anesthesia complications
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
## 2014-11-08 NOTE — Anesthesia Procedure Notes (Signed)
Procedure Name: Intubation Date/Time: 11/08/2014 10:42 AM Performed by: Ferol Luz L Pre-anesthesia Checklist: Patient identified, Emergency Drugs available, Suction available, Patient being monitored and Timeout performed Patient Re-evaluated:Patient Re-evaluated prior to inductionOxygen Delivery Method: Circle system utilized Preoxygenation: Pre-oxygenation with 100% oxygen Intubation Type: Rapid sequence, Cricoid Pressure applied and IV induction Laryngoscope Size: Mac and 3 Grade View: Grade I Tube type: Oral Tube size: 7.5 mm Number of attempts: 1 Airway Equipment and Method: Stylet Placement Confirmation: ETT inserted through vocal cords under direct vision,  positive ETCO2 and breath sounds checked- equal and bilateral Secured at: 22 cm Tube secured with: Tape Dental Injury: Teeth and Oropharynx as per pre-operative assessment

## 2014-11-08 NOTE — ED Notes (Signed)
Pt being transported to [facility] via stretcher.

## 2014-11-08 NOTE — Anesthesia Preprocedure Evaluation (Addendum)
Anesthesia Evaluation  Patient identified by MRN, date of birth, ID band Patient awake    Reviewed: Allergy & Precautions, NPO status , Patient's Chart, lab work & pertinent test results  Airway Mallampati: II  TM Distance: >3 FB Neck ROM: Full    Dental no notable dental hx.    Pulmonary neg pulmonary ROS, Current Smoker,    Pulmonary exam normal breath sounds clear to auscultation       Cardiovascular hypertension, Normal cardiovascular exam Rhythm:Regular Rate:Normal     Neuro/Psych negative neurological ROS  negative psych ROS   GI/Hepatic negative GI ROS, Neg liver ROS,   Endo/Other  negative endocrine ROS  Renal/GU negative Renal ROS     Musculoskeletal  (+) Arthritis ,   Abdominal   Peds  Hematology negative hematology ROS (+)   Anesthesia Other Findings   Reproductive/Obstetrics negative OB ROS                            Anesthesia Physical Anesthesia Plan  ASA: II  Anesthesia Plan: General   Post-op Pain Management:    Induction: Intravenous, Rapid sequence and Cricoid pressure planned  Airway Management Planned: Oral ETT  Additional Equipment:   Intra-op Plan:   Post-operative Plan: Extubation in OR  Informed Consent: I have reviewed the patients History and Physical, chart, labs and discussed the procedure including the risks, benefits and alternatives for the proposed anesthesia with the patient or authorized representative who has indicated his/her understanding and acceptance.   Dental advisory given  Plan Discussed with: CRNA  Anesthesia Plan Comments:        Anesthesia Quick Evaluation

## 2014-11-08 NOTE — Progress Notes (Signed)
ANTIBIOTIC CONSULT NOTE - INITIAL  Pharmacy Consult for Unasyn Indication: epistaxis  No Known Allergies  Patient Measurements: Height:  (165.1 cm) Weight: 195 lb (88.451 kg) IBW/kg (Calculated) : 61.5 Adjusted Body Weight:   Vital Signs: Temp: 98.7 F (37.1 C) (09/19 2210) Temp Source: Oral (09/19 2210) BP: 154/104 mmHg (09/20 0400) Pulse Rate: 101 (09/20 0400) Intake/Output from previous day:   Intake/Output from this shift:    Labs:  Recent Labs  11/07/14 2252  WBC 3.8*  HGB 13.0  PLT 249  CREATININE 0.95   Estimated Creatinine Clearance: 107.8 mL/min (by C-G formula based on Cr of 0.95). No results for input(s): VANCOTROUGH, VANCOPEAK, VANCORANDOM, GENTTROUGH, GENTPEAK, GENTRANDOM, TOBRATROUGH, TOBRAPEAK, TOBRARND, AMIKACINPEAK, AMIKACINTROU, AMIKACIN in the last 72 hours.   Microbiology: No results found for this or any previous visit (from the past 720 hour(s)).  Medical History: Past Medical History  Diagnosis Date  . Lupus   . Hypertension   . Allergy   . Arthritis     Medications:   (Not in a hospital admission) Scheduled:  . cloNIDine  0.1 mg Transdermal Weekly  . methylPREDNISolone (SOLU-MEDROL) injection  40 mg Intravenous Daily  . pantoprazole (PROTONIX) IV  40 mg Intravenous Q24H   Infusions:  . sodium chloride 1,000 mL (11/08/14 0351)   Assessment: 39yo male with history of HTN and lupus presents to ED with epistaxis. Pharmacy is consulted to dose unasyn for a nose bleed. Pt is afebrile, WBC 3.8, sCr 0.95.  Goal of Therapy:  prevention of infection  Plan:  Unasyn 1.5g IV q6h Follow up culture results, renal function and clinical course  Ray Davis. Newman Pies, PharmD Clinical Pharmacist Pager [phone] 11/08/2014,5:51 AM

## 2014-11-08 NOTE — Sedation Documentation (Signed)
6 fr exoseal placed in Right groin

## 2014-11-08 NOTE — Progress Notes (Signed)
Ray Davis JYN:[id number] DOB: 1975-06-12 DOA: 11/07/2014 PCP: Pcp Not In System   Subj: A/O 4, sitting in bed comfortably after embolization by Jamelle Rushing (IR).S/P Bilateral common carotid arteriograms,and bilateral external carotid arteriograms,followed superselective embolozation of bilateral IMAX arteries ,,bilateral infraorbital arteries, nasoseptal branches of facial arteries. Dr.Sanjeev Deveshwar has requested that packing remain in until ENT sees patient.    Obj:  Objective: VITAL SIGNS: Temp: 99.1 F (37.3 C) (09/20 2000) Temp Source: Oral (09/20 2000) BP: 153/83 mmHg (09/20 2244) Pulse Rate: 124 (09/20 2244) SPO2; FIO2:   Intake/Output Summary (Last 24 hours) at 11/08/14 2326 Last data filed at 11/08/14 2200  Gross per 24 hour  Intake 2461.25 ml  Output   2000 ml  Net 461.25 ml     Exam: General: A/O 4,No acute respiratory distress ENT; nasal packs still present bilateral naris negative bleeding     Procedure/Significant Events:  9/20 S/P Bilateral common carotid arteriograms,and bilateral external carotid arteriograms,followed superselective embolozation of bilateral IMAX arteries ,,bilateral infraorbital arteries, nasoseptal branches of facial arteries.   Culture   Antibiotics: Unasyn 9/20>>   A/P Epistaxis; per IR and ENT -Ensure ENT sees patient in the a.m.         Care during the described time interval was provided by me .  I have reviewed this patient's available data, including medical history, events of note, physical examination, and all test results as part of my evaluation. I have personally reviewed and interpreted all radiology studies.

## 2014-11-08 NOTE — ED Notes (Addendum)
Unasyn had not infused - new secondary attached - infusing currently. Arlys Fender, [place], in w/pt discussing procedure for controlling nosebleed. Pt signed consent form - Arlys Leandro took form. Monitor intact to pt.

## 2014-11-08 NOTE — Anesthesia Postprocedure Evaluation (Signed)
Anesthesia Post Note  Patient: Ray Davis  Procedure(s) Performed: Procedure(s) (LRB): RADIOLOGY WITH ANESTHESIA (N/A)  Anesthesia type: General  Patient location: PACU  Post pain: Pain level controlled  Post assessment: Post-op Vital signs reviewed  Last Vitals: BP 125/74 mmHg  Pulse 93  Temp(Src) 36.9 C (Oral)  Resp 25  Ht  (1.651 m)  Wt 190 lb 4.1 oz (86.3 kg)  BMI 31.66 kg/m2  SpO2 94%  Post vital signs: Reviewed  Level of consciousness: sedated  Complications: No apparent anesthesia complications
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
## 2014-11-08 NOTE — Consult Note (Signed)
PULMONARY / CRITICAL CARE MEDICINE   Name: Ray Davis MRN: [id number] DOB: Jul 02, 1975    ADMISSION DATE:  11/07/2014 CONSULTATION DATE:  9/20   REFERRING MD :  Corliss Skains   CHIEF COMPLAINT:  Epistaxis,   INITIAL PRESENTATION:  39 year old male w/ sig h/o lupus and prior h/o epistaxis requiring embolization. Admitted on 9/19 w/ recurrent epistaxis; went to Neuro-IR for embolization. PCCM asked to recover post-op in the ICU given risk for post-op resp failure.  STUDIES:    SIGNIFICANT EVENTS:    HISTORY OF PRESENT ILLNESS:   39 y.o. male with history of lupus and hypertension presents to the ER because of sudden onset of bleeding from the nose. Patient's symptoms started on 9/19 around 9 PM. In the ER patient had significant bleeding from the nose and had to have nasal packs placed. On-call ENT surgeon Dr. Annalee Genta and on-call interventional radiologist Dr. Corliss Skains were consulted. Patient has had similar episode 4 years ago when patient had required embolization. Patient's blood pressure in the ER was found to be significantly elevated and as per the patient has not been very compliant with his antihypertensives. Patient is being admitted for the management of his epistaxis. He was brought to IR where he underwent successful arterial embolization of the IMAX arteries, infraorbital arteries and nasoseptal arteries. He was extubated in the IR suite and transferred to the PACU. PCCM was asked to see re: concern for airway protection.   PAST MEDICAL HISTORY :   has a past medical history of Lupus; Hypertension; Allergy; Arthritis; and Nosebleed.  has past surgical history that includes Nose surgery (2014); Back surgery (1992); and Abdominal surgery (2002). Prior to Admission medications   Medication Sig Start Date End Date Taking? Authorizing Provider  amLODipine (NORVASC) 5 MG tablet Take 1 tablet (5 mg total) by mouth daily. 10/17/14  Yes Elvina Sidle, MD  aspirin 325 MG tablet Take 650  mg by mouth every 6 (six) hours as needed for moderate pain.   Yes Historical Provider, MD  cloNIDine (CATAPRES) 0.1 MG tablet Take 1 tablet (0.1 mg total) by mouth 2 (two) times daily. 10/17/14  Yes Elvina Sidle, MD  predniSONE (DELTASONE) 20 MG tablet Take 2 tablets (40 mg total) by mouth daily. 10/17/14  Yes Elvina Sidle, MD  triamcinolone cream (KENALOG) 0.1 % apply to affected area twice a day 10/17/14  Yes Elvina Sidle, MD   No Known Allergies  FAMILY HISTORY:  indicated that his mother is deceased. He indicated that his father is deceased.  SOCIAL HISTORY:  reports that he has been smoking Cigars.  He does not have any smokeless tobacco history on file. He reports that he drinks about 1.2 oz of alcohol per week. He reports that he does not use illicit drugs.  REVIEW OF SYSTEMS:  Unable, still sedated after morphine   SUBJECTIVE: c/o dry mouth only pain tolerable   VITAL SIGNS: Temp:  [97.7 F (36.5 C)-98.9 F (37.2 C)] 97.7 F (36.5 C) (09/20 1415) Pulse Rate:  [81-119] 81 (09/20 1430) Resp:  [16-29] 24 (09/20 1430) BP: (135-177)/(80-123) 135/83 mmHg (09/20 1430) SpO2:  [94 %-100 %] 97 % (09/20 1430) Weight:  [88.451 kg (195 lb)] 88.451 kg (195 lb) (09/19 2210) HEMODYNAMICS:   VENTILATOR SETTINGS:   INTAKE / OUTPUT:  Intake/Output Summary (Last 24 hours) at 11/08/14 1441 Last data filed at 11/08/14 1415  Gross per 24 hour  Intake   1600 ml  Output   1000 ml  Net  600 ml    PHYSICAL EXAMINATION: General:  Awake, resting comfortably on facemask.  Neuro:  Awake, oriented, no focal def  HEENT:  NCAT, both nares packed  Cardiovascular:  rrr Lungs:  Clear to auscultation w/out accessory muscle use  Abdomen:  Soft, non-tender + bowel sounds  Musculoskeletal:  Intact  Skin:  Intact   LABS:  CBC  Recent Labs Lab 11/07/14 2252 11/08/14 0616  WBC 3.8* 7.1  HGB 13.0 12.5*  HCT 37.4* 36.4*  PLT 249 236   Coag's  Recent Labs Lab 11/07/14 2252  INR  1.09   BMET  Recent Labs Lab 11/07/14 2252 11/08/14 0616  NA 132* 133*  K 3.2* 3.8  CL 103 103  CO2 16* 21*  BUN 13 19  CREATININE 0.95 0.96  GLUCOSE 123* 130*   Electrolytes  Recent Labs Lab 11/07/14 2252 11/08/14 0616  CALCIUM 8.7* 8.8*   Sepsis Markers No results for input(s): LATICACIDVEN, PROCALCITON, O2SATVEN in the last 168 hours. ABG No results for input(s): PHART, PCO2ART, PO2ART in the last 168 hours. Liver Enzymes  Recent Labs Lab 11/08/14 0616  AST 42*  ALT 53  ALKPHOS 66  BILITOT 0.9  ALBUMIN 3.4*   Cardiac Enzymes No results for input(s): TROPONINI, PROBNP in the last 168 hours. Glucose No results for input(s): GLUCAP in the last 168 hours.  Imaging No results found.   ASSESSMENT / PLAN: ENT A: Severe Epistaxis  P: Cont nasal packing; remove per ENT  Bedrest per IR Pulse checks (RLE from art sheath stick)   PULMONARY OETT A:  Post -op hypoxia.  Now better. Suspect that this was residual anesthesia effect. Now weaning FIO2  P:   NPO  Wean FIO2  Monitor respiratory status very closely, high risk of intubation.  CARDIOVASCULAR CVL A: HTN. Hypertensive urgency  P:  Resume home antihypertensives  Cont tele  Keep SBP <140  RENAL A:   Resolving metabolic acidosis  P:   Gentle IV hydration  F/u am chemistry   GASTROINTESTINAL A:  No acute  P:   NPO; adv diet as tolerated   HEMATOLOGIC A:   Acute blood loss anemia  P:  SCDs F/u cbc   INFECTIOUS A:  Empiric coverage for nasal packing  P:   unasyn 9/19>>>  ENDOCRINE A:   Mild hyperglycemia  Chronically on steroids at home. P:    Trend glucose  Solumedrol 40 mg IV daily  NEUROLOGIC A:  Post-op pain  P:   PRN analgesia   FAMILY  - Updates: pending   - Inter-disciplinary family meet or Palliative Care meeting due by:  9/27     TODAY'S SUMMARY:  Admit to ICU given risk of post-op resp failure. Asp precautions, conservative analgesia, cont empiric  abx for nasal packing.     Simonne Martinet ACNP-BC [facility] Pulmonary/Critical Care Pager # [phone] OR # [phone] if no answer   11/08/2014, 2:41 PM   Attending Note:  39 year old male with lupus who is chronically on steroids presenting with severe epistaxis.  Patient was taken to IR with embolization of suspected offending arteries.  Patient was subsequently extubated but given high risk of respiratory failure was to be admitted to ICU for observation overnight.  Patient will be in the ICU overnight for observation.  ENT called.  Continue steroids as solumedrol 40 mg IV daily.  Bleeding improved with packing on exam.  The patient is critically ill with multiple organ systems failure and requires high complexity decision  making for assessment and support, frequent evaluation and titration of therapies, application of advanced monitoring technologies and extensive interpretation of multiple databases.   Critical Care Time devoted to patient care services described in this note is  35  Minutes. This time reflects time of care of this signee Dr Koren Bound. This critical care time does not reflect procedure time, or teaching time or supervisory time of PA/NP/Med student/Med Resident etc but could involve care discussion time.  Alyson Reedy, M.D. [facility] Pulmonary/Critical Care Medicine. Pager: [phone]. After hours pager: [phone].

## 2014-11-08 NOTE — Procedures (Signed)
S/P Bilateral common carotid arteriograms,and bilateral external carotid arteriograms,followed superselective embolozation of bilateral IMAX arteries ,,bilateral infraorbital arteries, nasoseptal branches of facial arteries using PVA particles 250 to 350 microns,and 300 to and 500 to 700 microns .

## 2014-11-08 NOTE — Progress Notes (Signed)
eLink Physician-Brief Progress Note Patient Name: Ray Davis DOB: Jun 28, 1975 MRN: [id number]   Date of Service  11/08/2014  HPI/Events of Note  Patient c/o pain. Currently getting Morphine 1 mg IV Q 4 hours.  eICU Interventions  Will increase the dose to Morphine 1 mg IV Q 2 hours PRN.     Intervention Category Intermediate Interventions: Pain - evaluation and management Minor Interventions: Routine modifications to care plan (e.g. PRN medications for pain, fever)  Carmeron Heady [place] 11/08/2014, 8:48 PM

## 2014-11-09 ENCOUNTER — Encounter (HOSPITAL_COMMUNITY): Payer: Self-pay | Admitting: Interventional Radiology

## 2014-11-09 LAB — CBC WITH DIFFERENTIAL/PLATELET
BASOS ABS: 0 10*3/uL (ref 0.0–0.1)
BASOS PCT: 0 %
Eosinophils Absolute: 0 10*3/uL (ref 0.0–0.7)
Eosinophils Relative: 0 %
HCT: 34.3 % — ABNORMAL LOW (ref 39.0–52.0)
HEMOGLOBIN: 11.5 g/dL — AB (ref 13.0–17.0)
LYMPHS PCT: 8 %
Lymphs Abs: 0.9 10*3/uL (ref 0.7–4.0)
MCH: 28.5 pg (ref 26.0–34.0)
MCHC: 33.5 g/dL (ref 30.0–36.0)
MCV: 85.1 fL (ref 78.0–100.0)
MONO ABS: 0.6 10*3/uL (ref 0.1–1.0)
MONOS PCT: 5 %
NEUTROS ABS: 9.5 10*3/uL — AB (ref 1.7–7.7)
NEUTROS PCT: 87 %
Platelets: 255 10*3/uL (ref 150–400)
RBC: 4.03 MIL/uL — ABNORMAL LOW (ref 4.22–5.81)
RDW: 13.7 % (ref 11.5–15.5)
WBC: 10.9 10*3/uL — ABNORMAL HIGH (ref 4.0–10.5)

## 2014-11-09 LAB — BASIC METABOLIC PANEL
ANION GAP: 10 (ref 5–15)
BUN: 9 mg/dL (ref 6–20)
CHLORIDE: 102 mmol/L (ref 101–111)
CO2: 24 mmol/L (ref 22–32)
Calcium: 8.9 mg/dL (ref 8.9–10.3)
Creatinine, Ser: 1.14 mg/dL (ref 0.61–1.24)
GFR calc non Af Amer: >60 mL/min (ref >60)
GLUCOSE: 158 mg/dL — AB (ref 65–99)
Potassium: 3.7 mmol/L (ref 3.5–5.1)
Sodium: 136 mmol/L (ref 135–145)

## 2014-11-09 LAB — MAGNESIUM: Magnesium: 1.9 mg/dL (ref 1.7–2.4)

## 2014-11-09 LAB — PHOSPHORUS: PHOSPHORUS: 3.6 mg/dL (ref 2.5–4.6)

## 2014-11-09 MED ORDER — SALINE SPRAY 0.65 % NA SOLN
1.0000 | NASAL | Status: DC | PRN
  Administered 2014-11-09: 1 via NASAL
  Filled 2014-11-09 (×2): qty 44

## 2014-11-09 NOTE — Progress Notes (Signed)
  [place] TEAM 1 - Stepdown/ICU TEAM  Pt has been moved to ICU post Neuro IR procedure.  As per the protocol established by the Director of CCM, the pt has been transferred to the [facility] Service.  TRH will therefore sign off.  Lonia Blood, MD [facility] For Consults/Admissions - Flow Manager [phone] Office  [phone]  Contact MD directly via text page:      [email or web address]      password [facility]

## 2014-11-09 NOTE — Consult Note (Signed)
PULMONARY / CRITICAL CARE MEDICINE   Name: Ray Davis MRN: [id number] DOB: 1975/04/11    ADMISSION DATE:  11/07/2014 CONSULTATION DATE:  9/20   REFERRING MD :  Corliss Skains   CHIEF COMPLAINT:  Epistaxis,   INITIAL PRESENTATION:  39 year old male w/ sig h/o lupus and prior h/o epistaxis requiring embolization. Admitted on 9/19 w/ recurrent epistaxis; went to Neuro-IR for embolization. PCCM asked to recover post-op in the ICU given risk for post-op resp failure.   STUDIES:  9/20- S/P Bilateral common carotid arteriograms,and bilateral external carotid arteriograms,followed superselective embolozation of bilateral IMAX arteries ,,bilateral infraorbital arteries, nasoseptal branches of facial arteries.   SIGNIFICANT EVENTS:   HISTORY OF PRESENT ILLNESS:   39 y.o. male with history of lupus and hypertension presents to the ER because of sudden onset of bleeding from the nose. Patient's symptoms started on 9/19 around 9 PM. In the ER patient had significant bleeding from the nose and had to have nasal packs placed. On-call ENT surgeon Dr. Annalee Genta and on-call interventional radiologist Dr. Corliss Skains were consulted. Patient has had similar episode 4 years ago when patient had required embolization. Patient's blood pressure in the ER was found to be significantly elevated and as per the patient has not been very compliant with his antihypertensives. Patient is being admitted for the management of his epistaxis. He was brought to IR where he underwent successful arterial embolization of the IMAX arteries, infraorbital arteries and nasoseptal arteries. He was extubated in the IR suite and transferred to the PACU. PCCM was asked to see re: concern for airway protection.   PAST MEDICAL HISTORY :   has a past medical history of Lupus; Hypertension; Allergy; Arthritis; and Nosebleed.  has past surgical history that includes Nose surgery (2014); Back surgery (1992); and Abdominal surgery (2002). Prior to  Admission medications   Medication Sig Start Date End Date Taking? Authorizing Frances Ambrosino  amLODipine (NORVASC) 5 MG tablet Take 1 tablet (5 mg total) by mouth daily. 10/17/14  Yes Elvina Sidle, MD  aspirin 325 MG tablet Take 650 mg by mouth every 6 (six) hours as needed for moderate pain.   Yes Historical Cristina Mattern, MD  cloNIDine (CATAPRES) 0.1 MG tablet Take 1 tablet (0.1 mg total) by mouth 2 (two) times daily. 10/17/14  Yes Elvina Sidle, MD  predniSONE (DELTASONE) 20 MG tablet Take 2 tablets (40 mg total) by mouth daily. 10/17/14  Yes Elvina Sidle, MD  triamcinolone cream (KENALOG) 0.1 % apply to affected area twice a day 10/17/14  Yes Elvina Sidle, MD   No Known Allergies  FAMILY HISTORY:  indicated that his mother is deceased. He indicated that his father is deceased.  SOCIAL HISTORY:  reports that he has been smoking Cigars.  He does not have any smokeless tobacco history on file. He reports that he drinks about 1.2 oz of alcohol per week. He reports that he does not use illicit drugs.  REVIEW OF SYSTEMS:  Unable, still sedated after morphine   SUBJECTIVE: c/o dry mouth only pain tolerable   VITAL SIGNS: Temp:  [97.7 F (36.5 C)-99.5 F (37.5 C)] 99.5 F (37.5 C) (09/21 0743) Pulse Rate:  [76-124] 114 (09/21 0710) Resp:  [19-29] 23 (09/21 0710) BP: (125-164)/(74-108) 142/91 mmHg (09/21 0700) SpO2:  [90 %-100 %] 95 % (09/21 0710) Weight:  [190 lb 4.1 oz (86.3 kg)] 190 lb 4.1 oz (86.3 kg) (09/20 1537) HEMODYNAMICS:   VENTILATOR SETTINGS:   INTAKE / OUTPUT:  Intake/Output Summary (Last 24 hours) at 11/09/14  1610 Last data filed at 11/09/14 0700  Gross per 24 hour  Intake 3161.25 ml  Output   4200 ml  Net -1038.75 ml    PHYSICAL EXAMINATION: General:  Awake, no distress Neuro:  Awake, oriented, no focal deficits  HEENT:  NCAT, both nares packed  Cardiovascular:  RRR, No MRG Lungs:  Non labored breathing. Clear to ausculatation Abdomen:  Soft, non-tender + bowel  sounds  Musculoskeletal:  Intact  Skin:  Intact   LABS:  CBC  Recent Labs Lab 11/07/14 2252 11/08/14 0616 11/09/14 0224  WBC 3.8* 7.1 10.9*  HGB 13.0 12.5* 11.5*  HCT 37.4* 36.4* 34.3*  PLT 249 236 255   Coag's  Recent Labs Lab 11/07/14 2252  INR 1.09   BMET  Recent Labs Lab 11/07/14 2252 11/08/14 0616 11/09/14 0224  NA 132* 133* 136  K 3.2* 3.8 3.7  CL 103 103 102  CO2 16* 21* 24  BUN CREATININE 0.95 0.96 1.14  GLUCOSE 123* 130* 158*   Electrolytes  Recent Labs Lab 11/07/14 2252 11/08/14 0616 11/09/14 0224  CALCIUM 8.7* 8.8* 8.9  MG  --   --  1.9  PHOS  --   --  3.6   Sepsis Markers No results for input(s): LATICACIDVEN, PROCALCITON, O2SATVEN in the last 168 hours. ABG No results for input(s): PHART, PCO2ART, PO2ART in the last 168 hours. Liver Enzymes  Recent Labs Lab 11/08/14 0616  AST 42*  ALT 53  ALKPHOS 66  BILITOT 0.9  ALBUMIN 3.4*   Cardiac Enzymes No results for input(s): TROPONINI, PROBNP in the last 168 hours. Glucose No results for input(s): GLUCAP in the last 168 hours.  Imaging No results found.   ASSESSMENT / PLAN: ENT A: Severe Epistaxis  P: Cont nasal packing; ENT to see the patient today AM.  Bedrest per IR  PULMONARY OETT A:  Post -op hypoxia.  Now better. Suspect that this was residual anesthesia effect. Now weaning FIO2  P:   NPO  Wean FIO2  Stable resp status.  CARDIOVASCULAR CVL A: HTN. Hypertensive urgency  P:  Resume home antihypertensives  Cont tele  Keep SBP <140  RENAL A:   Resolving metabolic acidosis  P:   Gentle IV hydration  F/u am chemistry   GASTROINTESTINAL A:  No acute  P:   NPO for now.  HEMATOLOGIC A:   Acute blood loss anemia  P:  SCDs F/u cbc   INFECTIOUS A:  Empiric coverage for nasal packing  P:   unasyn 9/19>>>  ENDOCRINE A:   Mild hyperglycemia  Chronically on steroids at home. P:    Trend glucose  Solumedrol 40 mg IV  daily  NEUROLOGIC A:  Post-op pain  P:   PRN analgesia   FAMILY  - Updates: pending  - Inter-disciplinary family meet or Palliative Care meeting due by:  9/27   TODAY'S SUMMARY:  Admit to ICU given risk of post-op resp failure. Asp precautions, conservative analgesia, cont empiric abx for nasal packing.   Critical care time- 35 mins  Chilton Greathouse MD [facility] Pager [phone] If no answer or after 3pm call: [phone] 11/09/2014, 8:51 AM
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
## 2014-11-10 LAB — HEMOGLOBIN AND HEMATOCRIT, BLOOD
HCT: 30 % — ABNORMAL LOW (ref 39.0–52.0)
HCT: 30.3 % — ABNORMAL LOW (ref 39.0–52.0)
HEMATOCRIT: 29.3 % — AB (ref 39.0–52.0)
HEMOGLOBIN: 9.6 g/dL — AB (ref 13.0–17.0)
HEMOGLOBIN: 10 g/dL — AB (ref 13.0–17.0)
Hemoglobin: 10.1 g/dL — ABNORMAL LOW (ref 13.0–17.0)

## 2014-11-10 LAB — BASIC METABOLIC PANEL
Anion gap: 5 (ref 5–15)
BUN: 9 mg/dL (ref 6–20)
CHLORIDE: 103 mmol/L (ref 101–111)
CO2: 28 mmol/L (ref 22–32)
CREATININE: 0.99 mg/dL (ref 0.61–1.24)
Calcium: 8.6 mg/dL — ABNORMAL LOW (ref 8.9–10.3)
Glucose, Bld: 123 mg/dL — ABNORMAL HIGH (ref 65–99)
Potassium: 3.5 mmol/L (ref 3.5–5.1)
SODIUM: 136 mmol/L (ref 135–145)

## 2014-11-10 LAB — CBC
HCT: 28.9 % — ABNORMAL LOW (ref 39.0–52.0)
HEMOGLOBIN: 9.7 g/dL — AB (ref 13.0–17.0)
MCH: 28.7 pg (ref 26.0–34.0)
MCHC: 33.6 g/dL (ref 30.0–36.0)
MCV: 85.5 fL (ref 78.0–100.0)
PLATELETS: 218 10*3/uL (ref 150–400)
RBC: 3.38 MIL/uL — AB (ref 4.22–5.81)
RDW: 14 % (ref 11.5–15.5)
WBC: 8.4 10*3/uL (ref 4.0–10.5)

## 2014-11-10 MED ORDER — LISINOPRIL 5 MG PO TABS
5.0000 mg | ORAL_TABLET | Freq: Every day | ORAL | Status: DC
  Administered 2014-11-10 – 2014-11-11 (×2): 5 mg via ORAL
  Filled 2014-11-10 (×2): qty 1

## 2014-11-10 NOTE — Progress Notes (Signed)
Patient arrived to unit and assisted to bed by nursing staff.Oriented patient to unit and call bell given to patient.No acute distress noted at present time.Patient denies pain.Will continue to monitor.

## 2014-11-10 NOTE — Progress Notes (Signed)
[place] TEAM 1 - Stepdown/ICU TEAM Progress Note  Ray Davis NWG:[id number] DOB: 1975/07/30 DOA: 11/07/2014 PCP: Pcp Not In System  Admit HPI / Brief Narrative: Ray Davis is a 39 y.o. BM PMHx lupus and hypertension, previous history of refractory epistaxis.  presents to the ER because of sudden onset of bleeding from the nose. Patient's symptoms started last night around 9 PM. In the ER patient had significant bleeding from the nose and had to have nasal packs placed. On-call ENT surgeon Dr. Annalee Genta and on-call interventional radiologist Dr. Corliss Skains was consulted. Patient has had similar episode 4 years ago when patient had required embolization. Patient's blood pressure in the ER was found to be significantly elevated and as per the patient has not been very compliant with his antihypertensives. Patient is being admitted for the management of his epistaxis. Patient denies any antiplatelets agents though it is listed in his medication list.    HPI/Subjective: 9/22  A/O 4 resting comfortably in bed negative sign of nasal bleeding.  Assessment/Plan: Epistaxis;  -per IR and ENT  HTN -Continue clonidine transdermal 0.1 mg -Start lisinopril 5 mg daily -Hydralazine 10 mg PRN  Lupus -Solu-Medrol 40 mg daily   Code Status: FULL Family Communication: no family present at time of exam Disposition Plan: Per ENT    Consultants: Aida Raider (ENT) Dr.Sanjeev Deveshwar (IR)   Procedure/Significant Events: 9/20 S/P Bilateral common carotid arteriograms,and bilateral external carotid arteriograms,followed superselective embolozation of bilateral IMAX arteries ,,bilateral infraorbital arteries, nasoseptal branches of facial arteries   Culture   Antibiotics: Unasyn 9/20>>  DVT prophylaxis:    Devices    LINES / TUBES:      Continuous Infusions: . lactated ringers 75 mL/hr at 11/08/14 1803  . niCARDipine      Objective: VITAL SIGNS: Temp: 98.5 F  (36.9 C) (09/22 1722) Temp Source: Oral (09/22 1722) BP: 154/97 mmHg (09/22 1722) Pulse Rate: 78 (09/22 1722) SPO2; FIO2:   Intake/Output Summary (Last 24 hours) at 11/10/14 1951 Last data filed at 11/10/14 1728  Gross per 24 hour  Intake 1956.25 ml  Output   1101 ml  Net 855.25 ml     Exam: General: No acute respiratory distress Eyes: Negative headache, eye pain, double vision,negative scleral hemorrhage ENT: Negative Runny nose, negative ear pain, negative gingival bleeding, Neck:  Negative scars, masses, torticollis, lymphadenopathy, JVD Lungs: Clear to auscultation bilaterally without wheezes or crackles Cardiovascular: Regular rate and rhythm without murmur gallop or rub normal S1 and S2 Abdomen:negative abdominal pain, nondistended, positive soft, bowel sounds, no rebound, no ascites, no appreciable mass Extremities: No significant cyanosis, clubbing, or edema bilateral lower extremities Psychiatric:  Negative depression, negative anxiety, negative fatigue, negative mania  Neurologic:  Cranial nerves II through XII intact, tongue/uvula midline, all extremities muscle strength 5/5, sensation intact throughout, negative dysarthria, negative expressive aphasia, negative receptive aphasia.   Data Reviewed: Basic Metabolic Panel:  Recent Labs Lab 11/07/14 2252 11/08/14 0616 11/09/14 0224 11/10/14 0359  NA 132* 133* 136 136  K 3.2* 3.8 3.7 3.5  CL 103 103 102 103  CO2 16* 21* 24 28  GLUCOSE 123* 130* 158* 123*  BUN CREATININE 0.95 0.96 1.14 0.99  CALCIUM 8.7* 8.8* 8.9 8.6*  MG  --   --  1.9  --   PHOS  --   --  3.6  --    Liver Function Tests:  Recent Labs Lab 11/08/14 0616  AST 42*  ALT 53  ALKPHOS 66  BILITOT 0.9  PROT 7.0  ALBUMIN 3.4*   No results for input(s): LIPASE, AMYLASE in the last 168 hours. No results for input(s): AMMONIA in the last 168 hours. CBC:  Recent Labs Lab 11/07/14 2252 11/08/14 0616 11/09/14 0224 11/10/14 0359  11/10/14 0924 11/10/14 1523  WBC 3.8* 7.1 10.9* 8.4  --   --   NEUTROABS 1.8 5.3 9.5*  --   --   --   HGB 13.0 12.5* 11.5* 9.7* 9.6* 10.0*  HCT 37.4* 36.4* 34.3* 28.9* 29.3* 30.0*  MCV 83.5 83.5 85.1 85.5  --   --   PLT 249 236 255 218  --   --    Cardiac Enzymes: No results for input(s): CKTOTAL, CKMB, CKMBINDEX, TROPONINI in the last 168 hours. BNP (last 3 results) No results for input(s): BNP in the last 8760 hours.  ProBNP (last 3 results) No results for input(s): PROBNP in the last 8760 hours.  CBG: No results for input(s): GLUCAP in the last 168 hours.  No results found for this or any previous visit (from the past 240 hour(s)).   Studies:  Recent x-ray studies have been reviewed in detail by the Attending Physician  Scheduled Meds:  Scheduled Meds: . ampicillin-sulbactam (UNASYN) IV  1.5 g Intravenous Q6H  . cloNIDine  0.1 mg Transdermal Weekly  . methylPREDNISolone (SOLU-MEDROL) injection  40 mg Intravenous Daily  . pantoprazole (PROTONIX) IV  40 mg Intravenous Q24H    Time spent on care of this patient: 40 mins   WOODS, Roselind Messier , MD  [facility] Office  [phone] Pager [phone]  On-Call/Text Page:      [email or web address]      password [email or web address]  If 7PM-7AM, please contact night-coverage [email or web address] Password [facility] 11/10/2014, 7:51 PM   LOS: 2 days   Care during the described time interval was provided by me .  I have reviewed this patient's available data, including medical history, events of note, physical examination, and all test results as part of my evaluation. I have personally reviewed and interpreted all radiology studies.   Carolyne Littles, MD [phone] Pager

## 2014-11-11 DIAGNOSIS — R040 Epistaxis: Secondary | ICD-10-CM | POA: Diagnosis present

## 2014-11-11 LAB — HEMOGLOBIN AND HEMATOCRIT, BLOOD
HEMATOCRIT: 28.4 % — AB (ref 39.0–52.0)
HEMATOCRIT: 29.6 % — AB (ref 39.0–52.0)
HEMOGLOBIN: 10 g/dL — AB (ref 13.0–17.0)
Hemoglobin: 9.6 g/dL — ABNORMAL LOW (ref 13.0–17.0)

## 2014-11-11 MED ORDER — CLONIDINE HCL 0.1 MG/24HR TD PTWK: 0.1000 mg | MEDICATED_PATCH | TRANSDERMAL | Status: DC

## 2014-11-11 MED ORDER — OXYMETAZOLINE HCL 0.05 % NA SOLN: 1.0000 | Freq: Once | NASAL | Status: DC | PRN

## 2014-11-11 MED ORDER — LISINOPRIL 10 MG PO TABS: 10.0000 mg | ORAL_TABLET | Freq: Every day | ORAL | Status: DC

## 2014-11-11 MED ORDER — TRAMADOL HCL 50 MG PO TABS: 50.0000 mg | ORAL_TABLET | Freq: Four times a day (QID) | ORAL | Status: DC | PRN

## 2014-11-11 NOTE — Progress Notes (Signed)
ANTIBIOTIC CONSULT NOTE - INITIAL  Pharmacy Consult for Unasyn Indication: epistaxis  No Known Allergies  Patient Measurements: Height:  (165.1 cm) Weight: 193 lb 8 oz (87.771 kg) IBW/kg (Calculated) : 61.5 Adjusted Body Weight:   Vital Signs: Temp: 98.4 F (36.9 C) (09/23 1018) Temp Source: Oral (09/23 1018) BP: 147/90 mmHg (09/23 1018) Pulse Rate: 98 (09/23 1018) Intake/Output from previous day: 09/22 0701 - 09/23 0700 In: 1270 [P.O.:540; I.V.:680; IV Piggyback:50] Out: 700 [Urine:700] Intake/Output from this shift: Total I/O In: 120 [P.O.:120] Out: -   Labs:  Recent Labs  11/09/14 0224 11/10/14 0359  11/10/14 2117 11/11/14 0418 11/11/14 1304  WBC 10.9* 8.4  --   --   --   --   HGB 11.5* 9.7*  < > 10.1* 9.6* 10.0*  PLT 255 218  --   --   --   --   CREATININE 1.14 0.99  --   --   --   --   < > = values in this interval not displayed. Estimated Creatinine Clearance: 103 mL/min (by C-G formula based on Cr of 0.99). No results for input(s): VANCOTROUGH, VANCOPEAK, VANCORANDOM, GENTTROUGH, GENTPEAK, GENTRANDOM, TOBRATROUGH, TOBRAPEAK, TOBRARND, AMIKACINPEAK, AMIKACINTROU, AMIKACIN in the last 72 hours.   Microbiology: No results found for this or any previous visit (from the past 720 hour(s)).  Medical History: Past Medical History  Diagnosis Date  . Lupus   . Hypertension   . Allergy   . Arthritis   . Nosebleed     Medications:  Prescriptions prior to admission  Medication Sig Dispense Refill Last Dose  . amLODipine (NORVASC) 5 MG tablet Take 1 tablet (5 mg total) by mouth daily. 30 tablet 2 11/07/2014 at Unknown time  . aspirin 325 MG tablet Take 650 mg by mouth every 6 (six) hours as needed for moderate pain.   Past Month at Unknown time  . cloNIDine (CATAPRES) 0.1 MG tablet Take 1 tablet (0.1 mg total) by mouth 2 (two) times daily. 90 tablet 3 11/06/2014 at Unknown time  . predniSONE (DELTASONE) 20 MG tablet Take 2 tablets (40 mg total) by mouth  daily. 10 tablet 0 11/06/2014 at Unknown time  . triamcinolone cream (KENALOG) 0.1 % apply to affected area twice a day 45 g 2 11/06/2014 at Unknown time   Scheduled:  . ampicillin-sulbactam (UNASYN) IV  1.5 g Intravenous Q6H  . cloNIDine  0.1 mg Transdermal Weekly  . lisinopril  5 mg Oral Daily  . methylPREDNISolone (SOLU-MEDROL) injection  40 mg Intravenous Daily  . pantoprazole (PROTONIX) IV  40 mg Intravenous Q24H   Infusions:  . lactated ringers 10 mL/hr at 11/10/14 2049   Assessment: 39yo male with history of HTN and lupus presents to ED with epistaxis. He has been on Unasyn for empiric therapy only. Afebrile. Consider stopping abx at this point.   Goal of Therapy:  prevention of infection  Plan:   Cont Unasyn 1.5g IV q6 for now Consider stopping abx  Ulyses Southward, PharmD Pager: [phone] 11/11/2014 1:35 PM

## 2014-11-11 NOTE — Discharge Summary (Signed)
Physician Discharge Summary  Ray Davis ZOX:[id number] DOB: [birth year] DOA: 11/07/2014  PCP: Pcp Not In System  Admit date: 11/07/2014 Discharge date: 11/11/2014  Time spent: 40 minutes  Recommendations for Outpatient Follow-up:  Epistaxis;  -Resolved with procedure.  -Follow-up with Dr.David Annalee Genta (ENT) in 2 weeks  HTN -Continue clonidine transdermal 0.1 mg -Continue lisinopril 10 mg daily -Hydralazine 10 mg PRN  Lupus -Solu-Medrol 40 mg daily -Establish care at [facility] in 1-2 weeks, HTN, lupus, epistaxis   Discharge Diagnoses:  Principal Problem:   Epistaxis Active Problems:   LUPUS ERYTHEMATOSUS, DISCOID   Hypertensive urgency   Epistaxis, recurrent   Severe epistaxis   Discharge Condition: Stable  Diet recommendation: Regular  Filed Weights   11/07/14 2210 11/08/14 1537 11/09/14 2154  Weight: 88.451 kg (195 lb) 86.3 kg (190 lb 4.1 oz) 87.771 kg (193 lb 8 oz)    History of present illness:  Ray Davis is a 39 y.o. BM PMHx lupus and hypertension, previous history of refractory epistaxis.  presents to the ER because of sudden onset of bleeding from the nose. Patient's symptoms started last night around 9 PM. In the ER patient had significant bleeding from the nose and had to have nasal packs placed. On-call ENT surgeon Dr. Annalee Genta and on-call interventional radiologist Dr. Corliss Skains was consulted. Patient has had similar episode 4 years ago when patient had required embolization. Patient's blood pressure in the ER was found to be significantly elevated and as per the patient has not been very compliant with his antihypertensives. Patient is being admitted for the management of his epistaxis. Patient denies any antiplatelets agents though it is listed in his medication list.  During his hospitalization patient was seen by Aida Raider (ENT), and Dr.Sanjeev Deveshwar (IR) secondary to his recurrent epistaxis. On 9/20  S/P Bilateral common carotid arteriograms,and bilateral external carotid arteriograms,followed superselective embolozation of bilateral IMAX arteries ,,bilateral infraorbital arteries, nasoseptal branches of facial arteries. Has not had recurrence of nasal bleed since procedure.     Consultants: Aida Raider (ENT) Dr.Sanjeev Deveshwar (IR)   Procedure/Significant Events: 9/20 S/P Bilateral common carotid arteriograms,and bilateral external carotid arteriograms,followed superselective embolozation of bilateral IMAX arteries ,,bilateral infraorbital arteries, nasoseptal branches of facial arteries     Discharge Exam: Filed Vitals:   11/11/14 0555 11/11/14 0619 11/11/14 0733 11/11/14 1018  BP: 169/104 155/100 153/96 147/90  Pulse: 67  70 98  Temp: 99 F (37.2 C)   98.4 F (36.9 C)  TempSrc: Oral   Oral  Resp: 20   20  Height:      Weight:      SpO2: 98%   99%    General: No acute respiratory distress Eyes: Negative headache, eye pain, double vision,negative scleral hemorrhage ENT: Negative Runny nose, negative ear pain, negative gingival bleeding, Neck: Negative scars, masses, torticollis, lymphadenopathy, JVD Lungs: Clear to auscultation bilaterally without wheezes or crackles Cardiovascular: Regular rate and rhythm without murmur gallop or rub normal S1 and S2 Abdomen:negative abdominal pain, nondistended, positive soft, bowel sounds, no rebound, no ascites, no appreciable mass   Discharge Instructions      Discharge Instructions    Diet - low sodium heart healthy    Complete by:  As directed      Discharge instructions    Complete by:  As directed   1. Limited activity 2. Liquid and soft diet 3. May bathe and shower 4. Saline nasal spray - 4 puffs/nostril every hour while awake 5. Elevate  Head of Bed 6. No nose blowing     Increase activity slowly    Complete by:  As directed             Medication List    STOP taking these medications         amLODipine 5 MG tablet  Commonly known as:  NORVASC     aspirin 325 MG tablet     cloNIDine 0.1 MG tablet  Commonly known as:  CATAPRES  Replaced by:  cloNIDine 0.1 mg/24hr patch      TAKE these medications        cloNIDine 0.1 mg/24hr patch  Commonly known as:  CATAPRES - Dosed in mg/24 hr  Place 1 patch (0.1 mg total) onto the skin once a week.     lisinopril 10 MG tablet  Commonly known as:  PRINIVIL,ZESTRIL  Take 1 tablet (10 mg total) by mouth daily.  Start taking on:  11/12/2014     oxymetazoline 0.05 % nasal spray  Commonly known as:  AFRIN  Place 1 spray into both nostrils once as needed for congestion (Bedside procedure).     predniSONE 20 MG tablet  Commonly known as:  DELTASONE  Take 2 tablets (40 mg total) by mouth daily.     traMADol 50 MG tablet  Commonly known as:  ULTRAM  Take 1 tablet (50 mg total) by mouth every 6 (six) hours as needed for moderate pain.     triamcinolone cream 0.1 %  Commonly known as:  KENALOG  apply to affected area twice a day       No Known Allergies Follow-up Information    Follow up with SHOEMAKER, DAVID, MD In 2 weeks.   Specialty:  Otolaryngology   Contact information:   [address] [place] [address] [phone]       Follow up with [facility]    . Schedule an appointment as soon as possible for a visit in 2 weeks.   Why:  Establish care at [facility] in 1-2 weeks, HTN, lupus, epistaxis   Contact information:   [address] [facility] [address] [phone]       The results of significant diagnostics from this hospitalization (including imaging, microbiology, ancillary and laboratory) are listed below for reference.    Significant Diagnostic Studies: Ir Transcath/emboliz  11/10/2014   CLINICAL DATA:  Recurrent epistaxis.  EXAM: BILATERAL COMMON CAROTID AND INNOMINATE ANGIOGRAPHY AND BILATERAL  EXTERNAL CAROTID ARTERIOGRAMS FOLLOWED BY ENDOVASCULAR SUPERSELECTIVE EMBOLIZATION OF INFRAORBITAL ARTERIES, INTERNAL MAXILLARY ARTERIES, AND SEPTAL BRANCHES OF THE FACIAL ARTERIES WITH PVA PARTICLES:  PROCEDURE: Contrast: 125 mL OMNIPAQUE IOHEXOL 300 MG/ML  SOLN  Anesthesia/Sedation:   General anesthesia.  Medications:  As per general anesthesia.  Following a full explanation of the procedure along with the potential associated complications, an informed witnessed consent was obtained.  The right groin was prepped and draped in the usual sterile fashion. Thereafter using modified Seldinger technique, transfemoral access into the right common femoral artery was obtained without difficulty. Over a 0.035 inch guidewire, a 5 French Pinnacle sheath was inserted. Through this, and also over 0.035 inch guidewire, a 5 French JB1 catheter was advanced to the aortic arch region and selectively positioned in the right common carotid artery, the left common carotid artery, left external carotid artery and right external carotid artery.  There were no acute complications. The patient tolerated the procedure well.  FINDINGS: The right common carotid arteriogram demonstrates the right external carotid artery major branches to be widely patent proximally.  The right internal carotid artery at the bulb to the cranial skull base is seen to opacify normally.  The petrous, the cavernous and the supraclinoid segments are widely patent.  A prominent right ophthalmic artery is seen.  The right middle and the right anterior cerebral arteries opacify into the capillary and the venous phases.  A selective right external carotid arteriogram demonstrates abnormally prominent internal maxillary artery distally, the infraorbital artery and also the facial artery branches to the septal area.  The ascending pharyngeal artery is also noted to be patent with mild prominence.  The right occipital artery appears widely patent as well.  The left  common carotid arteriogram demonstrates the left external carotid artery and its major branches to be patent proximally.  The left internal carotid artery at the bulb to the cranial skull base opacifies normally. The petrous, the cavernous and the supraclinoid segments are widely patent.  There is mild fusiform dilatation of the internal carotid artery in the parapelvic region.  A left posterior communicating artery is seen opacifying the left posterior cerebral artery transiently.  The left middle and the left anterior cerebral arteries opacify into the capillary and the venous phases.  A selective left external carotid arteriogram demonstrates abnormal prominence of the distal internal maxillary artery, the intraorbital artery branches, and also of the septal branches from the left facial artery.  The ascending pharyngeal artery, the occipital artery are normally opacified.  ENDOVASCULAR EMBOLIZATION OF BILATERAL INTERNAL MAXILLARY ARTERIES, INFRAORBITAL ARTERIES AND THE FACIAL ARTERIES DISTALLY USING PVA PARTICLES OF SIZES 250-350, 350-500 MICRONS, AND 500-700 MICRONS.  The diagnostic JB1 catheter in the right common carotid artery was exchanged over a 0.035 inch 300 cm Rosen exchange guidewire for a 6 French 65 cm neurovascular sheath using biplane roadmap technique and constant fluoroscopic guidance. Good aspiration was obtained from the side port of the neurovascular sheath. A gentle contrast injection demonstrated no evidence of spasms, dissections or of intraluminal filling defects.  This was then connected to continuous heparinized saline infusion.  Over a 0.035 inch Roadrunner guidewire, a 6 [place] MDP 95 cm BriteTip Envoy guide catheter was then advanced and positioned just proximal to the right common carotid bifurcation. Using biplane roadmap technique and constant fluoroscopic guidance, over a 0.035 inch roadrunner guidewire, the 6 [place] guide catheter was advanced and positioned just proximal to the  origin of the facial artery.  The guidewire was removed. Good aspiration was obtained from the hub of the 6 [place] guide catheter. A gentle contrast injection demonstrated no evidence of spasms, dissections, or of intraluminal filling defects.  At this time in a coaxial manner and with constant heparinized saline infusion using biplane roadmap technique and constant fluoroscopic guidance, a rapid transit 2 tip microcatheter which had been steam-shaped was then advanced over a 0.014 inch regular Synchro micro guidewire to the distal end of the guide catheter.  With the micro guidewire leading with a J-Tip configuration, access was obtained without difficulty into the infraorbital artery on the right, the internal maxillary artery and then the proximal septal branches of the right facial artery over a micro guidewire. The microcatheter was advanced over a micro guidewire with the J-tip configuration to avoid dissections or inducing spasm, the first vessel embolized was the right infraorbital artery, followed by the internal maxillary artery with its inferior and medial branches projecting into the maxillary sinuses and the  ethmoid air cells and then the septal branches of the facial artery.  Prior to start of the embolizations, the position of the microcatheter was ascertained to be safe without evidence of abnormal dangerous communications with the ophthalmic artery, the internal carotid artery and the vertebral arteries.  Embolization was then performed with PVA particles mixed in 75% contrast and 25% heparinized saline infusion. Embolization was performed under constant intermittent fluoroscopy until there was stasis and/or reflux of the tip of the microcatheter.  Control arteriograms were performed via the microcatheter and the 6 [place] guide catheter in the proximal right external carotid artery to be ensure no evidence of new collaterals forming.  The microcatheter was then gently retrieved as was the 6 [place]  guide catheter into the right common carotid artery. A control arteriogram performed in the right common carotid artery but centered intracranially demonstrated no change in the intracranial circulation. No evidence of filling defects, or occlusions or slow hemodynamic flow was noted.  Attention was then given to the left external carotid artery branches. The 6 [place] guide catheter was again advanced into the proximal left external carotid artery as mentioned above. After having confirmed no evidence of spasms, dissections or of intraluminal filling defects, using biplane roadmap technique and constant fluoroscopic guidance, again a new rapid transit 2 tip microcatheter was advanced for a 0.014 inch soft tip Synchro micro guidewire to the infraorbital artery, the left internal maxillary artery, and the distal left facial artery branches supplying the septal area.  Again after having confirmed safe positioning of the tip of the microcatheter and having dangerous anastomosis with the vertebral arteries, the internal carotid artery ipsilaterally and the ophthalmic artery, embolization was carried out using PVA particles of sizes 250-350, 350-500, and 500-700 microns mixed in 75% contrast and 25% heparinized saline fusion. Again embolizations were performed as mentioned earlier until there was stasis at the tip of the microcatheter and all 3 branches embolized. The embolization was performed under biplane intermittent fluoroscopy to ensure safe delivery of the embolic material into the respective vessels.  The microcatheter was gently retrieved and removed. A control arteriogram performed through the 6 [place] guide catheter and left internal carotid artery demonstrated stasis and complete obliteration of flow in the embolized vessels.  The guide catheter was then advanced proximally into the left common carotid artery. A control arteriogram performed through the left common carotid artery but centered intracranially  demonstrated no filling defects, dissections, or of vessel occlusions. The venous outflow remain normal.  The 6 [place] guide catheter and the 6 French neurovascular sheath were then retrieved into the abdominal aorta and exchanged over a J-Tip guidewire for a 6 [place] Pinnacle sheath. This was then replaced via an external closure device successfully.  The patient's ACT during the procedure was maintained in the region of approximately 160 seconds.  Patient's general anesthesia was then reversed and the patient was extubated. Upon awakening, the patient demonstrated no new neurological signs symptoms. No active bleeding was noted in the pharyngeal region, and also in the nasal packs.  IMPRESSION: Status post endovascular superselective embolization of bilateral infraorbital arteries, bilateral internal maxillary arteries, and bilateral septal branches of the facial arteries with PVA particles of sizes 250-350 microns, 350-500 microns, and 500-700 microns as described above.   Electronically Signed   By: Julieanne Cotton M.D.   On: 11/09/2014 12:39   Ir Angiogram Follow Up Study  11/10/2014   CLINICAL DATA:  Recurrent epistaxis.  EXAM: BILATERAL COMMON CAROTID AND INNOMINATE ANGIOGRAPHY  AND BILATERAL EXTERNAL CAROTID ARTERIOGRAMS FOLLOWED BY ENDOVASCULAR SUPERSELECTIVE EMBOLIZATION OF INFRAORBITAL ARTERIES, INTERNAL MAXILLARY ARTERIES, AND SEPTAL BRANCHES OF THE FACIAL ARTERIES WITH PVA PARTICLES:  PROCEDURE: Contrast: 125 mL OMNIPAQUE IOHEXOL 300 MG/ML  SOLN  Anesthesia/Sedation:   General anesthesia.  Medications:  As per general anesthesia.  Following a full explanation of the procedure along with the potential associated complications, an informed witnessed consent was obtained.  The right groin was prepped and draped in the usual sterile fashion. Thereafter using modified Seldinger technique, transfemoral access into the right common femoral artery was obtained without difficulty. Over a 0.035 inch  guidewire, a 5 French Pinnacle sheath was inserted. Through this, and also over 0.035 inch guidewire, a 5 French JB1 catheter was advanced to the aortic arch region and selectively positioned in the right common carotid artery, the left common carotid artery, left external carotid artery and right external carotid artery.  There were no acute complications. The patient tolerated the procedure well.  FINDINGS: The right common carotid arteriogram demonstrates the right external carotid artery major branches to be widely patent proximally.  The right internal carotid artery at the bulb to the cranial skull base is seen to opacify normally.  The petrous, the cavernous and the supraclinoid segments are widely patent.  A prominent right ophthalmic artery is seen.  The right middle and the right anterior cerebral arteries opacify into the capillary and the venous phases.  A selective right external carotid arteriogram demonstrates abnormally prominent internal maxillary artery distally, the infraorbital artery and also the facial artery branches to the septal area.  The ascending pharyngeal artery is also noted to be patent with mild prominence.  The right occipital artery appears widely patent as well.  The left common carotid arteriogram demonstrates the left external carotid artery and its major branches to be patent proximally.  The left internal carotid artery at the bulb to the cranial skull base opacifies normally. The petrous, the cavernous and the supraclinoid segments are widely patent.  There is mild fusiform dilatation of the internal carotid artery in the parapelvic region.  A left posterior communicating artery is seen opacifying the left posterior cerebral artery transiently.  The left middle and the left anterior cerebral arteries opacify into the capillary and the venous phases.  A selective left external carotid arteriogram demonstrates abnormal prominence of the distal internal maxillary artery, the  intraorbital artery branches, and also of the septal branches from the left facial artery.  The ascending pharyngeal artery, the occipital artery are normally opacified.  ENDOVASCULAR EMBOLIZATION OF BILATERAL INTERNAL MAXILLARY ARTERIES, INFRAORBITAL ARTERIES AND THE FACIAL ARTERIES DISTALLY USING PVA PARTICLES OF SIZES 250-350, 350-500 MICRONS, AND 500-700 MICRONS.  The diagnostic JB1 catheter in the right common carotid artery was exchanged over a 0.035 inch 300 cm Rosen exchange guidewire for a 6 French 65 cm neurovascular sheath using biplane roadmap technique and constant fluoroscopic guidance. Good aspiration was obtained from the side port of the neurovascular sheath. A gentle contrast injection demonstrated no evidence of spasms, dissections or of intraluminal filling defects.  This was then connected to continuous heparinized saline infusion.  Over a 0.035 inch Roadrunner guidewire, a 6 [place] MDP 95 cm BriteTip Envoy guide catheter was then advanced and positioned just proximal to the right common carotid bifurcation. Using biplane roadmap technique and constant fluoroscopic guidance, over a 0.035 inch roadrunner guidewire, the 6 [place] guide catheter was advanced and positioned just proximal to the origin of the facial artery.  The guidewire  was removed. Good aspiration was obtained from the hub of the 6 [place] guide catheter. A gentle contrast injection demonstrated no evidence of spasms, dissections, or of intraluminal filling defects.  At this time in a coaxial manner and with constant heparinized saline infusion using biplane roadmap technique and constant fluoroscopic guidance, a rapid transit 2 tip microcatheter which had been steam-shaped was then advanced over a 0.014 inch regular Synchro micro guidewire to the distal end of the guide catheter.  With the micro guidewire leading with a J-Tip configuration, access was obtained without difficulty into the infraorbital artery on the right, the  internal maxillary artery and then the proximal septal branches of the right facial artery over a micro guidewire. The microcatheter was advanced over a micro guidewire with the J-tip configuration to avoid dissections or inducing spasm, the first vessel embolized was the right infraorbital artery, followed by the internal maxillary artery with its inferior and medial branches projecting into the maxillary sinuses and the ethmoid air cells and then the septal branches of the facial artery.  Prior to start of the embolizations, the position of the microcatheter was ascertained to be safe without evidence of abnormal dangerous communications with the ophthalmic artery, the internal carotid artery and the vertebral arteries.  Embolization was then performed with PVA particles mixed in 75% contrast and 25% heparinized saline infusion. Embolization was performed under constant intermittent fluoroscopy until there was stasis and/or reflux of the tip of the microcatheter.  Control arteriograms were performed via the microcatheter and the 6 [place] guide catheter in the proximal right external carotid artery to be ensure no evidence of new collaterals forming.  The microcatheter was then gently retrieved as was the 6 [place] guide catheter into the right common carotid artery. A control arteriogram performed in the right common carotid artery but centered intracranially demonstrated no change in the intracranial circulation. No evidence of filling defects, or occlusions or slow hemodynamic flow was noted.  Attention was then given to the left external carotid artery branches. The 6 [place] guide catheter was again advanced into the proximal left external carotid artery as mentioned above. After having confirmed no evidence of spasms, dissections or of intraluminal filling defects, using biplane roadmap technique and constant fluoroscopic guidance, again a new rapid transit 2 tip microcatheter was advanced for a 0.014 inch soft  tip Synchro micro guidewire to the infraorbital artery, the left internal maxillary artery, and the distal left facial artery branches supplying the septal area.  Again after having confirmed safe positioning of the tip of the microcatheter and having dangerous anastomosis with the vertebral arteries, the internal carotid artery ipsilaterally and the ophthalmic artery, embolization was carried out using PVA particles of sizes 250-350, 350-500, and 500-700 microns mixed in 75% contrast and 25% heparinized saline fusion. Again embolizations were performed as mentioned earlier until there was stasis at the tip of the microcatheter and all 3 branches embolized. The embolization was performed under biplane intermittent fluoroscopy to ensure safe delivery of the embolic material into the respective vessels.  The microcatheter was gently retrieved and removed. A control arteriogram performed through the 6 [place] guide catheter and left internal carotid artery demonstrated stasis and complete obliteration of flow in the embolized vessels.  The guide catheter was then advanced proximally into the left common carotid artery. A control arteriogram performed through the left common carotid artery but centered intracranially demonstrated no filling defects, dissections, or of vessel occlusions. The venous outflow remain normal.  The 6 [place]  guide catheter and the 6 French neurovascular sheath were then retrieved into the abdominal aorta and exchanged over a J-Tip guidewire for a 6 [place] Pinnacle sheath. This was then replaced via an external closure device successfully.  The patient's ACT during the procedure was maintained in the region of approximately 160 seconds.  Patient's general anesthesia was then reversed and the patient was extubated. Upon awakening, the patient demonstrated no new neurological signs symptoms. No active bleeding was noted in the pharyngeal region, and also in the nasal packs.  IMPRESSION: Status post  endovascular superselective embolization of bilateral infraorbital arteries, bilateral internal maxillary arteries, and bilateral septal branches of the facial arteries with PVA particles of sizes 250-350 microns, 350-500 microns, and 500-700 microns as described above.   Electronically Signed   By: Julieanne Cotton M.D.   On: 11/09/2014 12:39   Ir Angio Intra Extracran Sel Com Carotid Innominate Bilat Mod Sed  11/10/2014   CLINICAL DATA:  Recurrent epistaxis.  EXAM: BILATERAL COMMON CAROTID AND INNOMINATE ANGIOGRAPHY AND BILATERAL EXTERNAL CAROTID ARTERIOGRAMS FOLLOWED BY ENDOVASCULAR SUPERSELECTIVE EMBOLIZATION OF INFRAORBITAL ARTERIES, INTERNAL MAXILLARY ARTERIES, AND SEPTAL BRANCHES OF THE FACIAL ARTERIES WITH PVA PARTICLES:  PROCEDURE: Contrast: 125 mL OMNIPAQUE IOHEXOL 300 MG/ML  SOLN  Anesthesia/Sedation:   General anesthesia.  Medications:  As per general anesthesia.  Following a full explanation of the procedure along with the potential associated complications, an informed witnessed consent was obtained.  The right groin was prepped and draped in the usual sterile fashion. Thereafter using modified Seldinger technique, transfemoral access into the right common femoral artery was obtained without difficulty. Over a 0.035 inch guidewire, a 5 French Pinnacle sheath was inserted. Through this, and also over 0.035 inch guidewire, a 5 French JB1 catheter was advanced to the aortic arch region and selectively positioned in the right common carotid artery, the left common carotid artery, left external carotid artery and right external carotid artery.  There were no acute complications. The patient tolerated the procedure well.  FINDINGS: The right common carotid arteriogram demonstrates the right external carotid artery major branches to be widely patent proximally.  The right internal carotid artery at the bulb to the cranial skull base is seen to opacify normally.  The petrous, the cavernous and the  supraclinoid segments are widely patent.  A prominent right ophthalmic artery is seen.  The right middle and the right anterior cerebral arteries opacify into the capillary and the venous phases.  A selective right external carotid arteriogram demonstrates abnormally prominent internal maxillary artery distally, the infraorbital artery and also the facial artery branches to the septal area.  The ascending pharyngeal artery is also noted to be patent with mild prominence.  The right occipital artery appears widely patent as well.  The left common carotid arteriogram demonstrates the left external carotid artery and its major branches to be patent proximally.  The left internal carotid artery at the bulb to the cranial skull base opacifies normally. The petrous, the cavernous and the supraclinoid segments are widely patent.  There is mild fusiform dilatation of the internal carotid artery in the parapelvic region.  A left posterior communicating artery is seen opacifying the left posterior cerebral artery transiently.  The left middle and the left anterior cerebral arteries opacify into the capillary and the venous phases.  A selective left external carotid arteriogram demonstrates abnormal prominence of the distal internal maxillary artery, the intraorbital artery branches, and also of the septal branches from the left facial artery.  The ascending  pharyngeal artery, the occipital artery are normally opacified.  ENDOVASCULAR EMBOLIZATION OF BILATERAL INTERNAL MAXILLARY ARTERIES, INFRAORBITAL ARTERIES AND THE FACIAL ARTERIES DISTALLY USING PVA PARTICLES OF SIZES 250-350, 350-500 MICRONS, AND 500-700 MICRONS.  The diagnostic JB1 catheter in the right common carotid artery was exchanged over a 0.035 inch 300 cm Rosen exchange guidewire for a 6 French 65 cm neurovascular sheath using biplane roadmap technique and constant fluoroscopic guidance. Good aspiration was obtained from the side port of the neurovascular sheath.  A gentle contrast injection demonstrated no evidence of spasms, dissections or of intraluminal filling defects.  This was then connected to continuous heparinized saline infusion.  Over a 0.035 inch Roadrunner guidewire, a 6 [place] MDP 95 cm BriteTip Envoy guide catheter was then advanced and positioned just proximal to the right common carotid bifurcation. Using biplane roadmap technique and constant fluoroscopic guidance, over a 0.035 inch roadrunner guidewire, the 6 [place] guide catheter was advanced and positioned just proximal to the origin of the facial artery.  The guidewire was removed. Good aspiration was obtained from the hub of the 6 [place] guide catheter. A gentle contrast injection demonstrated no evidence of spasms, dissections, or of intraluminal filling defects.  At this time in a coaxial manner and with constant heparinized saline infusion using biplane roadmap technique and constant fluoroscopic guidance, a rapid transit 2 tip microcatheter which had been steam-shaped was then advanced over a 0.014 inch regular Synchro micro guidewire to the distal end of the guide catheter.  With the micro guidewire leading with a J-Tip configuration, access was obtained without difficulty into the infraorbital artery on the right, the internal maxillary artery and then the proximal septal branches of the right facial artery over a micro guidewire. The microcatheter was advanced over a micro guidewire with the J-tip configuration to avoid dissections or inducing spasm, the first vessel embolized was the right infraorbital artery, followed by the internal maxillary artery with its inferior and medial branches projecting into the maxillary sinuses and the ethmoid air cells and then the septal branches of the facial artery.  Prior to start of the embolizations, the position of the microcatheter was ascertained to be safe without evidence of abnormal dangerous communications with the ophthalmic artery, the internal  carotid artery and the vertebral arteries.  Embolization was then performed with PVA particles mixed in 75% contrast and 25% heparinized saline infusion. Embolization was performed under constant intermittent fluoroscopy until there was stasis and/or reflux of the tip of the microcatheter.  Control arteriograms were performed via the microcatheter and the 6 [place] guide catheter in the proximal right external carotid artery to be ensure no evidence of new collaterals forming.  The microcatheter was then gently retrieved as was the 6 [place] guide catheter into the right common carotid artery. A control arteriogram performed in the right common carotid artery but centered intracranially demonstrated no change in the intracranial circulation. No evidence of filling defects, or occlusions or slow hemodynamic flow was noted.  Attention was then given to the left external carotid artery branches. The 6 [place] guide catheter was again advanced into the proximal left external carotid artery as mentioned above. After having confirmed no evidence of spasms, dissections or of intraluminal filling defects, using biplane roadmap technique and constant fluoroscopic guidance, again a new rapid transit 2 tip microcatheter was advanced for a 0.014 inch soft tip Synchro micro guidewire to the infraorbital artery, the left internal maxillary artery, and the distal left facial artery branches supplying the septal area.  Again after having confirmed safe positioning of the tip of the microcatheter and having dangerous anastomosis with the vertebral arteries, the internal carotid artery ipsilaterally and the ophthalmic artery, embolization was carried out using PVA particles of sizes 250-350, 350-500, and 500-700 microns mixed in 75% contrast and 25% heparinized saline fusion. Again embolizations were performed as mentioned earlier until there was stasis at the tip of the microcatheter and all 3 branches embolized. The embolization was  performed under biplane intermittent fluoroscopy to ensure safe delivery of the embolic material into the respective vessels.  The microcatheter was gently retrieved and removed. A control arteriogram performed through the 6 [place] guide catheter and left internal carotid artery demonstrated stasis and complete obliteration of flow in the embolized vessels.  The guide catheter was then advanced proximally into the left common carotid artery. A control arteriogram performed through the left common carotid artery but centered intracranially demonstrated no filling defects, dissections, or of vessel occlusions. The venous outflow remain normal.  The 6 [place] guide catheter and the 6 French neurovascular sheath were then retrieved into the abdominal aorta and exchanged over a J-Tip guidewire for a 6 [place] Pinnacle sheath. This was then replaced via an external closure device successfully.  The patient's ACT during the procedure was maintained in the region of approximately 160 seconds.  Patient's general anesthesia was then reversed and the patient was extubated. Upon awakening, the patient demonstrated no new neurological signs symptoms. No active bleeding was noted in the pharyngeal region, and also in the nasal packs.  IMPRESSION: Status post endovascular superselective embolization of bilateral infraorbital arteries, bilateral internal maxillary arteries, and bilateral septal branches of the facial arteries with PVA particles of sizes 250-350 microns, 350-500 microns, and 500-700 microns as described above.   Electronically Signed   By: Julieanne Cotton M.D.   On: 11/09/2014 12:39   Ir Angio External Carotid Sel Ext Carotid Bilat Mod Sed  11/10/2014   CLINICAL DATA:  Recurrent epistaxis.  EXAM: BILATERAL COMMON CAROTID AND INNOMINATE ANGIOGRAPHY AND BILATERAL EXTERNAL CAROTID ARTERIOGRAMS FOLLOWED BY ENDOVASCULAR SUPERSELECTIVE EMBOLIZATION OF INFRAORBITAL ARTERIES, INTERNAL MAXILLARY ARTERIES, AND SEPTAL  BRANCHES OF THE FACIAL ARTERIES WITH PVA PARTICLES:  PROCEDURE: Contrast: 125 mL OMNIPAQUE IOHEXOL 300 MG/ML  SOLN  Anesthesia/Sedation:   General anesthesia.  Medications:  As per general anesthesia.  Following a full explanation of the procedure along with the potential associated complications, an informed witnessed consent was obtained.  The right groin was prepped and draped in the usual sterile fashion. Thereafter using modified Seldinger technique, transfemoral access into the right common femoral artery was obtained without difficulty. Over a 0.035 inch guidewire, a 5 French Pinnacle sheath was inserted. Through this, and also over 0.035 inch guidewire, a 5 French JB1 catheter was advanced to the aortic arch region and selectively positioned in the right common carotid artery, the left common carotid artery, left external carotid artery and right external carotid artery.  There were no acute complications. The patient tolerated the procedure well.  FINDINGS: The right common carotid arteriogram demonstrates the right external carotid artery major branches to be widely patent proximally.  The right internal carotid artery at the bulb to the cranial skull base is seen to opacify normally.  The petrous, the cavernous and the supraclinoid segments are widely patent.  A prominent right ophthalmic artery is seen.  The right middle and the right anterior cerebral arteries opacify into the capillary and the venous phases.  A selective right external carotid arteriogram demonstrates  abnormally prominent internal maxillary artery distally, the infraorbital artery and also the facial artery branches to the septal area.  The ascending pharyngeal artery is also noted to be patent with mild prominence.  The right occipital artery appears widely patent as well.  The left common carotid arteriogram demonstrates the left external carotid artery and its major branches to be patent proximally.  The left internal carotid artery  at the bulb to the cranial skull base opacifies normally. The petrous, the cavernous and the supraclinoid segments are widely patent.  There is mild fusiform dilatation of the internal carotid artery in the parapelvic region.  A left posterior communicating artery is seen opacifying the left posterior cerebral artery transiently.  The left middle and the left anterior cerebral arteries opacify into the capillary and the venous phases.  A selective left external carotid arteriogram demonstrates abnormal prominence of the distal internal maxillary artery, the intraorbital artery branches, and also of the septal branches from the left facial artery.  The ascending pharyngeal artery, the occipital artery are normally opacified.  ENDOVASCULAR EMBOLIZATION OF BILATERAL INTERNAL MAXILLARY ARTERIES, INFRAORBITAL ARTERIES AND THE FACIAL ARTERIES DISTALLY USING PVA PARTICLES OF SIZES 250-350, 350-500 MICRONS, AND 500-700 MICRONS.  The diagnostic JB1 catheter in the right common carotid artery was exchanged over a 0.035 inch 300 cm Rosen exchange guidewire for a 6 French 65 cm neurovascular sheath using biplane roadmap technique and constant fluoroscopic guidance. Good aspiration was obtained from the side port of the neurovascular sheath. A gentle contrast injection demonstrated no evidence of spasms, dissections or of intraluminal filling defects.  This was then connected to continuous heparinized saline infusion.  Over a 0.035 inch Roadrunner guidewire, a 6 [place] MDP 95 cm BriteTip Envoy guide catheter was then advanced and positioned just proximal to the right common carotid bifurcation. Using biplane roadmap technique and constant fluoroscopic guidance, over a 0.035 inch roadrunner guidewire, the 6 [place] guide catheter was advanced and positioned just proximal to the origin of the facial artery.  The guidewire was removed. Good aspiration was obtained from the hub of the 6 [place] guide catheter. A gentle contrast  injection demonstrated no evidence of spasms, dissections, or of intraluminal filling defects.  At this time in a coaxial manner and with constant heparinized saline infusion using biplane roadmap technique and constant fluoroscopic guidance, a rapid transit 2 tip microcatheter which had been steam-shaped was then advanced over a 0.014 inch regular Synchro micro guidewire to the distal end of the guide catheter.  With the micro guidewire leading with a J-Tip configuration, access was obtained without difficulty into the infraorbital artery on the right, the internal maxillary artery and then the proximal septal branches of the right facial artery over a micro guidewire. The microcatheter was advanced over a micro guidewire with the J-tip configuration to avoid dissections or inducing spasm, the first vessel embolized was the right infraorbital artery, followed by the internal maxillary artery with its inferior and medial branches projecting into the maxillary sinuses and the ethmoid air cells and then the septal branches of the facial artery.  Prior to start of the embolizations, the position of the microcatheter was ascertained to be safe without evidence of abnormal dangerous communications with the ophthalmic artery, the internal carotid artery and the vertebral arteries.  Embolization was then performed with PVA particles mixed in 75% contrast and 25% heparinized saline infusion. Embolization was performed under constant intermittent fluoroscopy until there was stasis and/or reflux of the tip of the microcatheter.  Control arteriograms were performed via the microcatheter and the 6 [place] guide catheter in the proximal right external carotid artery to be ensure no evidence of new collaterals forming.  The microcatheter was then gently retrieved as was the 6 [place] guide catheter into the right common carotid artery. A control arteriogram performed in the right common carotid artery but centered intracranially  demonstrated no change in the intracranial circulation. No evidence of filling defects, or occlusions or slow hemodynamic flow was noted.  Attention was then given to the left external carotid artery branches. The 6 [place] guide catheter was again advanced into the proximal left external carotid artery as mentioned above. After having confirmed no evidence of spasms, dissections or of intraluminal filling defects, using biplane roadmap technique and constant fluoroscopic guidance, again a new rapid transit 2 tip microcatheter was advanced for a 0.014 inch soft tip Synchro micro guidewire to the infraorbital artery, the left internal maxillary artery, and the distal left facial artery branches supplying the septal area.  Again after having confirmed safe positioning of the tip of the microcatheter and having dangerous anastomosis with the vertebral arteries, the internal carotid artery ipsilaterally and the ophthalmic artery, embolization was carried out using PVA particles of sizes 250-350, 350-500, and 500-700 microns mixed in 75% contrast and 25% heparinized saline fusion. Again embolizations were performed as mentioned earlier until there was stasis at the tip of the microcatheter and all 3 branches embolized. The embolization was performed under biplane intermittent fluoroscopy to ensure safe delivery of the embolic material into the respective vessels.  The microcatheter was gently retrieved and removed. A control arteriogram performed through the 6 [place] guide catheter and left internal carotid artery demonstrated stasis and complete obliteration of flow in the embolized vessels.  The guide catheter was then advanced proximally into the left common carotid artery. A control arteriogram performed through the left common carotid artery but centered intracranially demonstrated no filling defects, dissections, or of vessel occlusions. The venous outflow remain normal.  The 6 [place] guide catheter and the 6 French  neurovascular sheath were then retrieved into the abdominal aorta and exchanged over a J-Tip guidewire for a 6 [place] Pinnacle sheath. This was then replaced via an external closure device successfully.  The patient's ACT during the procedure was maintained in the region of approximately 160 seconds.  Patient's general anesthesia was then reversed and the patient was extubated. Upon awakening, the patient demonstrated no new neurological signs symptoms. No active bleeding was noted in the pharyngeal region, and also in the nasal packs.  IMPRESSION: Status post endovascular superselective embolization of bilateral infraorbital arteries, bilateral internal maxillary arteries, and bilateral septal branches of the facial arteries with PVA particles of sizes 250-350 microns, 350-500 microns, and 500-700 microns as described above.   Electronically Signed   By: Julieanne Cotton M.D.   On: 11/09/2014 12:39   Ir Neuro Each Add'l After Basic Uni Left (ms)  11/10/2014   CLINICAL DATA:  Recurrent epistaxis.  EXAM: BILATERAL COMMON CAROTID AND INNOMINATE ANGIOGRAPHY AND BILATERAL EXTERNAL CAROTID ARTERIOGRAMS FOLLOWED BY ENDOVASCULAR SUPERSELECTIVE EMBOLIZATION OF INFRAORBITAL ARTERIES, INTERNAL MAXILLARY ARTERIES, AND SEPTAL BRANCHES OF THE FACIAL ARTERIES WITH PVA PARTICLES:  PROCEDURE: Contrast: 125 mL OMNIPAQUE IOHEXOL 300 MG/ML  SOLN  Anesthesia/Sedation:   General anesthesia.  Medications:  As per general anesthesia.  Following a full explanation of the procedure along with the potential associated complications, an informed witnessed consent was obtained.  The right groin was prepped and draped in the  usual sterile fashion. Thereafter using modified Seldinger technique, transfemoral access into the right common femoral artery was obtained without difficulty. Over a 0.035 inch guidewire, a 5 French Pinnacle sheath was inserted. Through this, and also over 0.035 inch guidewire, a 5 French JB1 catheter was advanced to  the aortic arch region and selectively positioned in the right common carotid artery, the left common carotid artery, left external carotid artery and right external carotid artery.  There were no acute complications. The patient tolerated the procedure well.  FINDINGS: The right common carotid arteriogram demonstrates the right external carotid artery major branches to be widely patent proximally.  The right internal carotid artery at the bulb to the cranial skull base is seen to opacify normally.  The petrous, the cavernous and the supraclinoid segments are widely patent.  A prominent right ophthalmic artery is seen.  The right middle and the right anterior cerebral arteries opacify into the capillary and the venous phases.  A selective right external carotid arteriogram demonstrates abnormally prominent internal maxillary artery distally, the infraorbital artery and also the facial artery branches to the septal area.  The ascending pharyngeal artery is also noted to be patent with mild prominence.  The right occipital artery appears widely patent as well.  The left common carotid arteriogram demonstrates the left external carotid artery and its major branches to be patent proximally.  The left internal carotid artery at the bulb to the cranial skull base opacifies normally. The petrous, the cavernous and the supraclinoid segments are widely patent.  There is mild fusiform dilatation of the internal carotid artery in the parapelvic region.  A left posterior communicating artery is seen opacifying the left posterior cerebral artery transiently.  The left middle and the left anterior cerebral arteries opacify into the capillary and the venous phases.  A selective left external carotid arteriogram demonstrates abnormal prominence of the distal internal maxillary artery, the intraorbital artery branches, and also of the septal branches from the left facial artery.  The ascending pharyngeal artery, the occipital artery  are normally opacified.  ENDOVASCULAR EMBOLIZATION OF BILATERAL INTERNAL MAXILLARY ARTERIES, INFRAORBITAL ARTERIES AND THE FACIAL ARTERIES DISTALLY USING PVA PARTICLES OF SIZES 250-350, 350-500 MICRONS, AND 500-700 MICRONS.  The diagnostic JB1 catheter in the right common carotid artery was exchanged over a 0.035 inch 300 cm Rosen exchange guidewire for a 6 French 65 cm neurovascular sheath using biplane roadmap technique and constant fluoroscopic guidance. Good aspiration was obtained from the side port of the neurovascular sheath. A gentle contrast injection demonstrated no evidence of spasms, dissections or of intraluminal filling defects.  This was then connected to continuous heparinized saline infusion.  Over a 0.035 inch Roadrunner guidewire, a 6 [place] MDP 95 cm BriteTip Envoy guide catheter was then advanced and positioned just proximal to the right common carotid bifurcation. Using biplane roadmap technique and constant fluoroscopic guidance, over a 0.035 inch roadrunner guidewire, the 6 [place] guide catheter was advanced and positioned just proximal to the origin of the facial artery.  The guidewire was removed. Good aspiration was obtained from the hub of the 6 [place] guide catheter. A gentle contrast injection demonstrated no evidence of spasms, dissections, or of intraluminal filling defects.  At this time in a coaxial manner and with constant heparinized saline infusion using biplane roadmap technique and constant fluoroscopic guidance, a rapid transit 2 tip microcatheter which had been steam-shaped was then advanced over a 0.014 inch regular Synchro micro guidewire to the distal end of the guide  catheter.  With the micro guidewire leading with a J-Tip configuration, access was obtained without difficulty into the infraorbital artery on the right, the internal maxillary artery and then the proximal septal branches of the right facial artery over a micro guidewire. The microcatheter was advanced over a  micro guidewire with the J-tip configuration to avoid dissections or inducing spasm, the first vessel embolized was the right infraorbital artery, followed by the internal maxillary artery with its inferior and medial branches projecting into the maxillary sinuses and the ethmoid air cells and then the septal branches of the facial artery.  Prior to start of the embolizations, the position of the microcatheter was ascertained to be safe without evidence of abnormal dangerous communications with the ophthalmic artery, the internal carotid artery and the vertebral arteries.  Embolization was then performed with PVA particles mixed in 75% contrast and 25% heparinized saline infusion. Embolization was performed under constant intermittent fluoroscopy until there was stasis and/or reflux of the tip of the microcatheter.  Control arteriograms were performed via the microcatheter and the 6 [place] guide catheter in the proximal right external carotid artery to be ensure no evidence of new collaterals forming.  The microcatheter was then gently retrieved as was the 6 [place] guide catheter into the right common carotid artery. A control arteriogram performed in the right common carotid artery but centered intracranially demonstrated no change in the intracranial circulation. No evidence of filling defects, or occlusions or slow hemodynamic flow was noted.  Attention was then given to the left external carotid artery branches. The 6 [place] guide catheter was again advanced into the proximal left external carotid artery as mentioned above. After having confirmed no evidence of spasms, dissections or of intraluminal filling defects, using biplane roadmap technique and constant fluoroscopic guidance, again a new rapid transit 2 tip microcatheter was advanced for a 0.014 inch soft tip Synchro micro guidewire to the infraorbital artery, the left internal maxillary artery, and the distal left facial artery branches supplying the  septal area.  Again after having confirmed safe positioning of the tip of the microcatheter and having dangerous anastomosis with the vertebral arteries, the internal carotid artery ipsilaterally and the ophthalmic artery, embolization was carried out using PVA particles of sizes 250-350, 350-500, and 500-700 microns mixed in 75% contrast and 25% heparinized saline fusion. Again embolizations were performed as mentioned earlier until there was stasis at the tip of the microcatheter and all 3 branches embolized. The embolization was performed under biplane intermittent fluoroscopy to ensure safe delivery of the embolic material into the respective vessels.  The microcatheter was gently retrieved and removed. A control arteriogram performed through the 6 [place] guide catheter and left internal carotid artery demonstrated stasis and complete obliteration of flow in the embolized vessels.  The guide catheter was then advanced proximally into the left common carotid artery. A control arteriogram performed through the left common carotid artery but centered intracranially demonstrated no filling defects, dissections, or of vessel occlusions. The venous outflow remain normal.  The 6 [place] guide catheter and the 6 French neurovascular sheath were then retrieved into the abdominal aorta and exchanged over a J-Tip guidewire for a 6 [place] Pinnacle sheath. This was then replaced via an external closure device successfully.  The patient's ACT during the procedure was maintained in the region of approximately 160 seconds.  Patient's general anesthesia was then reversed and the patient was extubated. Upon awakening, the patient demonstrated no new neurological signs symptoms. No active bleeding was noted  in the pharyngeal region, and also in the nasal packs.  IMPRESSION: Status post endovascular superselective embolization of bilateral infraorbital arteries, bilateral internal maxillary arteries, and bilateral septal branches of  the facial arteries with PVA particles of sizes 250-350 microns, 350-500 microns, and 500-700 microns as described above.   Electronically Signed   By: Julieanne Cotton M.D.   On: 11/09/2014 12:39   Ir Neuro Each Add'l After Basic Uni Right (ms)  11/10/2014   CLINICAL DATA:  Recurrent epistaxis.  EXAM: BILATERAL COMMON CAROTID AND INNOMINATE ANGIOGRAPHY AND BILATERAL EXTERNAL CAROTID ARTERIOGRAMS FOLLOWED BY ENDOVASCULAR SUPERSELECTIVE EMBOLIZATION OF INFRAORBITAL ARTERIES, INTERNAL MAXILLARY ARTERIES, AND SEPTAL BRANCHES OF THE FACIAL ARTERIES WITH PVA PARTICLES:  PROCEDURE: Contrast: 125 mL OMNIPAQUE IOHEXOL 300 MG/ML  SOLN  Anesthesia/Sedation:   General anesthesia.  Medications:  As per general anesthesia.  Following a full explanation of the procedure along with the potential associated complications, an informed witnessed consent was obtained.  The right groin was prepped and draped in the usual sterile fashion. Thereafter using modified Seldinger technique, transfemoral access into the right common femoral artery was obtained without difficulty. Over a 0.035 inch guidewire, a 5 French Pinnacle sheath was inserted. Through this, and also over 0.035 inch guidewire, a 5 French JB1 catheter was advanced to the aortic arch region and selectively positioned in the right common carotid artery, the left common carotid artery, left external carotid artery and right external carotid artery.  There were no acute complications. The patient tolerated the procedure well.  FINDINGS: The right common carotid arteriogram demonstrates the right external carotid artery major branches to be widely patent proximally.  The right internal carotid artery at the bulb to the cranial skull base is seen to opacify normally.  The petrous, the cavernous and the supraclinoid segments are widely patent.  A prominent right ophthalmic artery is seen.  The right middle and the right anterior cerebral arteries opacify into the capillary  and the venous phases.  A selective right external carotid arteriogram demonstrates abnormally prominent internal maxillary artery distally, the infraorbital artery and also the facial artery branches to the septal area.  The ascending pharyngeal artery is also noted to be patent with mild prominence.  The right occipital artery appears widely patent as well.  The left common carotid arteriogram demonstrates the left external carotid artery and its major branches to be patent proximally.  The left internal carotid artery at the bulb to the cranial skull base opacifies normally. The petrous, the cavernous and the supraclinoid segments are widely patent.  There is mild fusiform dilatation of the internal carotid artery in the parapelvic region.  A left posterior communicating artery is seen opacifying the left posterior cerebral artery transiently.  The left middle and the left anterior cerebral arteries opacify into the capillary and the venous phases.  A selective left external carotid arteriogram demonstrates abnormal prominence of the distal internal maxillary artery, the intraorbital artery branches, and also of the septal branches from the left facial artery.  The ascending pharyngeal artery, the occipital artery are normally opacified.  ENDOVASCULAR EMBOLIZATION OF BILATERAL INTERNAL MAXILLARY ARTERIES, INFRAORBITAL ARTERIES AND THE FACIAL ARTERIES DISTALLY USING PVA PARTICLES OF SIZES 250-350, 350-500 MICRONS, AND 500-700 MICRONS.  The diagnostic JB1 catheter in the right common carotid artery was exchanged over a 0.035 inch 300 cm Rosen exchange guidewire for a 6 French 65 cm neurovascular sheath using biplane roadmap technique and constant fluoroscopic guidance. Good aspiration was obtained from the side port of the  neurovascular sheath. A gentle contrast injection demonstrated no evidence of spasms, dissections or of intraluminal filling defects.  This was then connected to continuous heparinized saline  infusion.  Over a 0.035 inch Roadrunner guidewire, a 6 [place] MDP 95 cm BriteTip Envoy guide catheter was then advanced and positioned just proximal to the right common carotid bifurcation. Using biplane roadmap technique and constant fluoroscopic guidance, over a 0.035 inch roadrunner guidewire, the 6 [place] guide catheter was advanced and positioned just proximal to the origin of the facial artery.  The guidewire was removed. Good aspiration was obtained from the hub of the 6 [place] guide catheter. A gentle contrast injection demonstrated no evidence of spasms, dissections, or of intraluminal filling defects.  At this time in a coaxial manner and with constant heparinized saline infusion using biplane roadmap technique and constant fluoroscopic guidance, a rapid transit 2 tip microcatheter which had been steam-shaped was then advanced over a 0.014 inch regular Synchro micro guidewire to the distal end of the guide catheter.  With the micro guidewire leading with a J-Tip configuration, access was obtained without difficulty into the infraorbital artery on the right, the internal maxillary artery and then the proximal septal branches of the right facial artery over a micro guidewire. The microcatheter was advanced over a micro guidewire with the J-tip configuration to avoid dissections or inducing spasm, the first vessel embolized was the right infraorbital artery, followed by the internal maxillary artery with its inferior and medial branches projecting into the maxillary sinuses and the ethmoid air cells and then the septal branches of the facial artery.  Prior to start of the embolizations, the position of the microcatheter was ascertained to be safe without evidence of abnormal dangerous communications with the ophthalmic artery, the internal carotid artery and the vertebral arteries.  Embolization was then performed with PVA particles mixed in 75% contrast and 25% heparinized saline infusion. Embolization was  performed under constant intermittent fluoroscopy until there was stasis and/or reflux of the tip of the microcatheter.  Control arteriograms were performed via the microcatheter and the 6 [place] guide catheter in the proximal right external carotid artery to be ensure no evidence of new collaterals forming.  The microcatheter was then gently retrieved as was the 6 [place] guide catheter into the right common carotid artery. A control arteriogram performed in the right common carotid artery but centered intracranially demonstrated no change in the intracranial circulation. No evidence of filling defects, or occlusions or slow hemodynamic flow was noted.  Attention was then given to the left external carotid artery branches. The 6 [place] guide catheter was again advanced into the proximal left external carotid artery as mentioned above. After having confirmed no evidence of spasms, dissections or of intraluminal filling defects, using biplane roadmap technique and constant fluoroscopic guidance, again a new rapid transit 2 tip microcatheter was advanced for a 0.014 inch soft tip Synchro micro guidewire to the infraorbital artery, the left internal maxillary artery, and the distal left facial artery branches supplying the septal area.  Again after having confirmed safe positioning of the tip of the microcatheter and having dangerous anastomosis with the vertebral arteries, the internal carotid artery ipsilaterally and the ophthalmic artery, embolization was carried out using PVA particles of sizes 250-350, 350-500, and 500-700 microns mixed in 75% contrast and 25% heparinized saline fusion. Again embolizations were performed as mentioned earlier until there was stasis at the tip of the microcatheter and all 3 branches embolized. The embolization was performed under biplane intermittent  fluoroscopy to ensure safe delivery of the embolic material into the respective vessels.  The microcatheter was gently retrieved and  removed. A control arteriogram performed through the 6 [place] guide catheter and left internal carotid artery demonstrated stasis and complete obliteration of flow in the embolized vessels.  The guide catheter was then advanced proximally into the left common carotid artery. A control arteriogram performed through the left common carotid artery but centered intracranially demonstrated no filling defects, dissections, or of vessel occlusions. The venous outflow remain normal.  The 6 [place] guide catheter and the 6 French neurovascular sheath were then retrieved into the abdominal aorta and exchanged over a J-Tip guidewire for a 6 [place] Pinnacle sheath. This was then replaced via an external closure device successfully.  The patient's ACT during the procedure was maintained in the region of approximately 160 seconds.  Patient's general anesthesia was then reversed and the patient was extubated. Upon awakening, the patient demonstrated no new neurological signs symptoms. No active bleeding was noted in the pharyngeal region, and also in the nasal packs.  IMPRESSION: Status post endovascular superselective embolization of bilateral infraorbital arteries, bilateral internal maxillary arteries, and bilateral septal branches of the facial arteries with PVA particles of sizes 250-350 microns, 350-500 microns, and 500-700 microns as described above.   Electronically Signed   By: Julieanne Cotton M.D.   On: 11/09/2014 12:39    Microbiology: No results found for this or any previous visit (from the past 240 hour(s)).   Labs: Basic Metabolic Panel:  Recent Labs Lab 11/07/14 2252 11/08/14 0616 11/09/14 0224 11/10/14 0359  NA 132* 133* 136 136  K 3.2* 3.8 3.7 3.5  CL 103 103 102 103  CO2 16* 21* 24 28  GLUCOSE 123* 130* 158* 123*  BUN 13 19 9 9   CREATININE 0.95 0.96 1.14 0.99  CALCIUM 8.7* 8.8* 8.9 8.6*  MG  --   --  1.9  --   PHOS  --   --  3.6  --    Liver Function Tests:  Recent Labs Lab  11/08/14 0616  AST 42*  ALT 53  ALKPHOS 66  BILITOT 0.9  PROT 7.0  ALBUMIN 3.4*   No results for input(s): LIPASE, AMYLASE in the last 168 hours. No results for input(s): AMMONIA in the last 168 hours. CBC:  Recent Labs Lab 11/07/14 2252 11/08/14 0616 11/09/14 0224 11/10/14 0359 11/10/14 0924 11/10/14 1523 11/10/14 2117 11/11/14 0418 11/11/14 1304  WBC 3.8* 7.1 10.9* 8.4  --   --   --   --   --   NEUTROABS 1.8 5.3 9.5*  --   --   --   --   --   --   HGB 13.0 12.5* 11.5* 9.7* 9.6* 10.0* 10.1* 9.6* 10.0*  HCT 37.4* 36.4* 34.3* 28.9* 29.3* 30.0* 30.3* 28.4* 29.6*  MCV 83.5 83.5 85.1 85.5  --   --   --   --   --   PLT 249 236 255 218  --   --   --   --   --    Cardiac Enzymes: No results for input(s): CKTOTAL, CKMB, CKMBINDEX, TROPONINI in the last 168 hours. BNP: BNP (last 3 results) No results for input(s): BNP in the last 8760 hours.  ProBNP (last 3 results) No results for input(s): PROBNP in the last 8760 hours.  CBG: No results for input(s): GLUCAP in the last 168 hours.     Signed:  Carolyne Littles, MD [facility] [phone]  pager

## 2014-11-30 ENCOUNTER — Encounter: Payer: Self-pay | Admitting: Family Medicine

## 2014-11-30 ENCOUNTER — Ambulatory Visit: Payer: Self-pay | Attending: Family Medicine | Admitting: Family Medicine

## 2014-11-30 VITALS — BP 148/97 | HR 72 | Temp 98.3°F | Resp 18 | Ht 65.0 in | Wt 196.2 lb

## 2014-11-30 DIAGNOSIS — R030 Elevated blood-pressure reading, without diagnosis of hypertension: Secondary | ICD-10-CM

## 2014-11-30 DIAGNOSIS — R7303 Prediabetes: Secondary | ICD-10-CM

## 2014-11-30 DIAGNOSIS — I10 Essential (primary) hypertension: Secondary | ICD-10-CM

## 2014-11-30 DIAGNOSIS — R7301 Impaired fasting glucose: Secondary | ICD-10-CM

## 2014-11-30 DIAGNOSIS — G44219 Episodic tension-type headache, not intractable: Secondary | ICD-10-CM

## 2014-11-30 DIAGNOSIS — R519 Headache, unspecified: Secondary | ICD-10-CM | POA: Insufficient documentation

## 2014-11-30 DIAGNOSIS — R51 Headache: Secondary | ICD-10-CM

## 2014-11-30 LAB — GLUCOSE, POCT (MANUAL RESULT ENTRY): POC Glucose: 97 mg/dl (ref 70–99)

## 2014-11-30 LAB — POCT GLYCOSYLATED HEMOGLOBIN (HGB A1C): Hemoglobin A1C: 6

## 2014-11-30 MED ORDER — LISINOPRIL 20 MG PO TABS: 20.0000 mg | ORAL_TABLET | Freq: Every day | ORAL | Status: DC

## 2014-11-30 MED ORDER — KETOROLAC TROMETHAMINE 30 MG/ML IJ SOLN
30.0000 mg | Freq: Once | INTRAMUSCULAR | Status: AC
  Administered 2014-11-30: 30 mg via INTRAMUSCULAR

## 2014-11-30 NOTE — Patient Instructions (Signed)
It was a pleasure to see you today.   For the blood pressure, I am raising the dose of your lisinopril to 20mg  daily.  You may take 2 tablets of the remaining 10mg  tablets; I [facility] 20[facility]  For the headache, temporarily may try ibuprofen 200mg  tablets, take 2 to 3 tablets by mouth every 6 hours with something to eat, as needed for headache.  I suspect a tension-type headache that should improve with this medicine.   FOLLOW UP in 1 to 2 WEEKS FOR BLOOD PRESSURE CHECK AND TO SEE HOW HEADACHE IS DOING.

## 2014-11-30 NOTE — Addendum Note (Signed)
Addended by: Margaretmary LombardLISBON, Dailyn Kempner K on: 11/30/2014 03:56 PM   Modules accepted: Orders

## 2014-11-30 NOTE — Assessment & Plan Note (Signed)
Patient with HTN, not ideally controlled and reports good adherence to treatment since hospital stay last month. Increase lisinopril from 10 to 20mg  daily, continue clonidine patch.  Follow up in 1-2 weeks and may wish to check BMet for K and BUN/Cr given increased ACEI dose today.

## 2014-11-30 NOTE — Progress Notes (Signed)
Patient here for HFU.  Patient complains of HA since Hospital visit. Constant sharp aching pain.

## 2014-11-30 NOTE — Progress Notes (Signed)
   Subjective:    Patient ID: Ray Davis, male    DOB: 09/09/1975, 39 y.o.   MRN: [id number]  HPI Patient for follow up from hospital, where he had embolization procedure for epistaxis.  Discharge 9/23, has not had bloody nose since then. Has had recurrent headaches that start in the temples (either or both, alternating).  Oxycodone helps, but tramadol does not.  Some photophobia on ROS; no nausea/vomiting. Does have the HA when he wakes up.   Has been adherent with HTN meds.  No side effects that he is aware of.   SLE, intermittent prednisone.  No Rheumatology following him.   In hospital had IFG; his A1C here is 6%.  He is not aware of being diagnosed with DM.   Is applying for Medicaid and has a hearing coming up soon.    Review of Systems     Objective:   Physical Exam Well no distress HEENT Neck supple. No frontal or maxillary sinus tendnress. TMs clear. Clear oropharynx and no obvious dental caries. Moist mucus membranes. Temples with mild bilateral tenderness with palpation. No epistaxis noted.  No cervical adenopathy.  COR regular S1s2 PULM Clear bilaterally.        Assessment & Plan:

## 2014-12-01 LAB — MICROALBUMIN / CREATININE URINE RATIO
CREATININE, URINE: 207 mg/dL
MICROALB UR: 23.9 mg/dL — AB (ref <2.0)
MICROALB/CREAT RATIO: 115.5 mg/g — AB (ref 0.0–30.0)

## 2014-12-14 ENCOUNTER — Encounter: Payer: Self-pay | Admitting: Pharmacist

## 2014-12-20 ENCOUNTER — Ambulatory Visit: Payer: Self-pay | Attending: Family Medicine | Admitting: Pharmacist

## 2014-12-20 VITALS — BP 129/88 | HR 73

## 2014-12-20 DIAGNOSIS — I10 Essential (primary) hypertension: Secondary | ICD-10-CM | POA: Insufficient documentation

## 2014-12-20 DIAGNOSIS — Z79899 Other long term (current) drug therapy: Secondary | ICD-10-CM | POA: Insufficient documentation

## 2014-12-20 LAB — BASIC METABOLIC PANEL
BUN: 14 mg/dL (ref 7–25)
CHLORIDE: 105 mmol/L (ref 98–110)
CO2: 25 mmol/L (ref 20–31)
Calcium: 9.5 mg/dL (ref 8.6–10.3)
Creat: 0.99 mg/dL (ref 0.60–1.35)
Glucose, Bld: 109 mg/dL — ABNORMAL HIGH (ref 65–99)
POTASSIUM: 4.9 mmol/L (ref 3.5–5.3)
SODIUM: 136 mmol/L (ref 135–146)

## 2014-12-20 NOTE — Patient Instructions (Signed)
Thanks for coming to see me today!  Your blood pressure looks great!! Keep taking your medications as directed.  We will get some blood work today and let you know the results.

## 2014-12-20 NOTE — Progress Notes (Signed)
S:    Patient arrives in good spirits with his significant other.    Presents to the clinic for hypertension evaluation.   Patient reports adherence with medications.  Current BP Medications include:  Clonidine patch 0.1 mg and lisinopril 20 mg daily.   O:   Last 3 Office BP readings: BP Readings from Last 3 Encounters:  12/20/14 129/88  11/30/14 148/97  11/11/14 147/90    BMET    Component Value Date/Time   NA 136 11/10/2014 0359   K 3.5 11/10/2014 0359   CL 103 11/10/2014 0359   CO2 28 11/10/2014 0359   GLUCOSE 123* 11/10/2014 0359   BUN 9 11/10/2014 0359   CREATININE 0.99 11/10/2014 0359   CREATININE 0.98 10/19/2013 1450   CALCIUM 8.6* 11/10/2014 0359   GFRNONAA >60 11/10/2014 0359   GFRNONAA >89 10/19/2013 1450   GFRAA >60 11/10/2014 0359   GFRAA >89 10/19/2013 1450    A/P: History of hypertension currently controlled on current medications. No recommendations for any changes and patient will continue current medications as prescribed. Due to the increase in lisinopril dose, will obtain BMET today. Patient verbalized understanding.   Results reviewed and written information provided.   Total time in face-to-face counseling 10 minutes.   F/U Clinic Visit with primary care physician as directed.

## 2015-02-01 ENCOUNTER — Emergency Department (INDEPENDENT_AMBULATORY_CARE_PROVIDER_SITE_OTHER)
Admission: EM | Admit: 2015-02-01 | Discharge: 2015-02-01 | Disposition: A | Discharge status: Home / Self Care | Payer: PRIVATE HEALTH INSURANCE | Source: Home / Self Care

## 2015-02-01 ENCOUNTER — Encounter (HOSPITAL_COMMUNITY): Payer: Self-pay | Admitting: Emergency Medicine

## 2015-02-01 DIAGNOSIS — M25561 Pain in right knee: Secondary | ICD-10-CM

## 2015-02-01 MED ORDER — HYDROCODONE-ACETAMINOPHEN 5-325 MG PO TABS: 1.0000 | ORAL_TABLET | Freq: Four times a day (QID) | ORAL | Status: DC | PRN

## 2015-02-01 NOTE — Discharge Instructions (Signed)
How to Use a Knee Brace °A knee brace is a device that you wear to support your knee, especially if the knee is healing after an injury or surgery. There are several types of knee braces. Some are designed to prevent an injury (prophylactic brace). These are often worn during sports. Others support an injured knee (functional brace) or keep it still while it heals (rehabilitative brace). People with severe arthritis of the knee may benefit from a brace that takes some pressure off the knee (unloader brace). Most knee braces are made from a combination of cloth and metal or plastic.  °You may need to wear a knee brace to: °· Relieve knee pain. °· Help your knee support your weight (improve stability). °· Help you walk farther (improve mobility). °· Prevent injury. °· Support your knee while it heals from surgery or from an injury. °RISKS AND COMPLICATIONS °Generally, knee braces are very safe to wear. However, problems may occur, including: °· Skin irritation that may lead to infection. °· Making your condition worse if you wear the brace in the wrong way. °HOW TO USE A KNEE BRACE °Different braces will have different instructions for use. Your health care provider will tell you or show you: °· How to put on your brace. °· How to adjust the brace. °· When and how often to wear the brace. °· How to remove the brace. °· If you will need any assistive devices in addition to the brace, such as crutches or a cane. °In general, your brace should: °· Have the hinge of the brace line up with the bend of your knee. °· Have straps, hooks, or tapes that fasten snugly around your leg. °· Not feel too tight or too loose.  °HOW TO CARE FOR A KNEE BRACE °· Check your brace often for signs of damage, such as loose connections or attachments. Your knee brace may get damaged or wear out during normal use. °· Wash the fabric parts of your brace with soap and water. °· Read the insert that comes with your brace for other specific care  instructions.  °SEEK MEDICAL CARE IF: °· Your knee brace is too loose or too tight and you cannot adjust it. °· Your knee brace causes skin redness, swelling, bruising, or irritation. °· Your knee brace is not helping. °· Your knee brace is making your knee pain worse. °  °This information is not intended to replace advice given to you by your health care provider. Make sure you discuss any questions you have with your health care provider. °  °Document Released: 04/27/2003 Document Revised: 10/26/2014 Document Reviewed: 05/30/2014 °Elsevier Interactive Patient Education ©2016 [employer] ° °Heat Therapy °Heat therapy can help ease sore, stiff, injured, and tight muscles and joints. Heat relaxes your muscles, which may help ease your pain. Heat therapy should only be used on old, pre-existing, or long-lasting (chronic) injuries. Do not use heat therapy unless told by your doctor. °HOW TO USE HEAT THERAPY °There are several different kinds of heat therapy, including: °· Moist heat pack. °· Warm water bath. °· Hot water bottle. °· Electric heating pad. °· Heated gel pack. °· Heated wrap. °· Electric heating pad. °GENERAL HEAT THERAPY RECOMMENDATIONS  °· Do not sleep while using heat therapy. Only use heat therapy while you are awake. °· Your skin may turn pink while using heat therapy. Do not use heat therapy if your skin turns red. °· Do not use heat therapy if you have new pain. °· High heat   or long exposure to heat can cause burns. Be careful when using heat therapy to avoid burning your skin. °· Do not use heat therapy on areas of your skin that are already irritated, such as with a rash or sunburn. °GET HELP IF:  °· You have blisters, redness, swelling (puffiness), or numbness. °· You have new pain. °· Your pain is worse. °MAKE SURE YOU: °· Understand these instructions. °· Will watch your condition. °· Will get help right away if you are not doing well or get worse. °  °This information is not intended to  replace advice given to you by your health care provider. Make sure you discuss any questions you have with your health care provider. °  °Document Released: 04/29/2011 Document Revised: 02/25/2014 Document Reviewed: 03/30/2013 °Elsevier Interactive Patient Education ©2016 [employer] ° °
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
## 2015-02-01 NOTE — ED Notes (Signed)
C/o bilateral knee pain for six months  States injured knee months ago Went to ER for tx

## 2015-04-13 ENCOUNTER — Other Ambulatory Visit (HOSPITAL_COMMUNITY): Payer: Self-pay | Admitting: Orthopedic Surgery

## 2015-04-13 DIAGNOSIS — M25561 Pain in right knee: Secondary | ICD-10-CM

## 2015-05-25 ENCOUNTER — Ambulatory Visit (HOSPITAL_COMMUNITY)
Admission: RE | Admit: 2015-05-25 | Discharge: 2015-05-25 | Disposition: A | Discharge status: Home / Self Care | Payer: Self-pay | Source: Ambulatory Visit | Attending: Orthopedic Surgery | Admitting: Orthopedic Surgery

## 2015-05-25 DIAGNOSIS — M25561 Pain in right knee: Secondary | ICD-10-CM

## 2015-05-25 DIAGNOSIS — M25461 Effusion, right knee: Secondary | ICD-10-CM | POA: Insufficient documentation

## 2015-05-25 DIAGNOSIS — S8331XA Tear of articular cartilage of right knee, current, initial encounter: Secondary | ICD-10-CM | POA: Insufficient documentation

## 2015-05-25 DIAGNOSIS — X58XXXA Exposure to other specified factors, initial encounter: Secondary | ICD-10-CM | POA: Insufficient documentation

## 2015-05-25 DIAGNOSIS — M25562 Pain in left knee: Secondary | ICD-10-CM | POA: Insufficient documentation

## 2015-05-25 DIAGNOSIS — S83242A Other tear of medial meniscus, current injury, left knee, initial encounter: Secondary | ICD-10-CM | POA: Insufficient documentation

## 2015-05-29 ENCOUNTER — Other Ambulatory Visit: Payer: Self-pay | Admitting: Orthopedic Surgery

## 2015-06-08 ENCOUNTER — Encounter (HOSPITAL_COMMUNITY)
Admission: RE | Admit: 2015-06-08 | Discharge: 2015-06-08 | Disposition: A | Discharge status: Home / Self Care | Payer: Self-pay | Source: Ambulatory Visit | Attending: Orthopedic Surgery | Admitting: Orthopedic Surgery

## 2015-06-08 ENCOUNTER — Encounter (HOSPITAL_COMMUNITY): Payer: Self-pay

## 2015-06-08 ENCOUNTER — Other Ambulatory Visit: Payer: Self-pay

## 2015-06-08 DIAGNOSIS — Z01812 Encounter for preprocedural laboratory examination: Secondary | ICD-10-CM | POA: Insufficient documentation

## 2015-06-08 DIAGNOSIS — M2341 Loose body in knee, right knee: Secondary | ICD-10-CM | POA: Insufficient documentation

## 2015-06-08 DIAGNOSIS — I10 Essential (primary) hypertension: Secondary | ICD-10-CM | POA: Insufficient documentation

## 2015-06-08 DIAGNOSIS — M25861 Other specified joint disorders, right knee: Secondary | ICD-10-CM | POA: Insufficient documentation

## 2015-06-08 DIAGNOSIS — Z01818 Encounter for other preprocedural examination: Secondary | ICD-10-CM | POA: Insufficient documentation

## 2015-06-08 LAB — CBC
HEMATOCRIT: 38.9 % — AB (ref 39.0–52.0)
HEMOGLOBIN: 13.1 g/dL (ref 13.0–17.0)
MCH: 27.7 pg (ref 26.0–34.0)
MCHC: 33.7 g/dL (ref 30.0–36.0)
MCV: 82.2 fL (ref 78.0–100.0)
Platelets: 286 10*3/uL (ref 150–400)
RBC: 4.73 MIL/uL (ref 4.22–5.81)
RDW: 13.5 % (ref 11.5–15.5)
WBC: 4.1 10*3/uL (ref 4.0–10.5)

## 2015-06-08 LAB — BASIC METABOLIC PANEL
Anion gap: 10 (ref 5–15)
BUN: 10 mg/dL (ref 6–20)
CO2: 22 mmol/L (ref 22–32)
Calcium: 9.4 mg/dL (ref 8.9–10.3)
Chloride: 109 mmol/L (ref 101–111)
Creatinine, Ser: 1.08 mg/dL (ref 0.61–1.24)
GFR calc Af Amer: >60 mL/min (ref >60)
GLUCOSE: 121 mg/dL — AB (ref 65–99)
POTASSIUM: 3.9 mmol/L (ref 3.5–5.1)
Sodium: 141 mmol/L (ref 135–145)

## 2015-06-08 NOTE — Progress Notes (Signed)
PCP is Dr. Milus GlazierLauenstein  Denies ever seeing a cardiologist. Denies ever having a stress test, echo, or card cath.

## 2015-06-08 NOTE — Pre-Procedure Instructions (Addendum)
Thereasa SoloJohn Barrientes  06/08/2015      [id number]- [place], [place] - [address] [place] [address] Phone: [phone] Fax: [phone]  [facility] [address] [facility] [place] - [address] [place] [address] Phone: [phone] Fax: [phone]    Your procedure is scheduled on April 27  [facility] at 1000 A.M.  Call this number if you have problems the morning of surgery:  [phone]   Remember:  Do not eat food or drink liquids after midnight.  Take these medicines the morning of surgery with A SIP OF WATER Hydrocodone (norco) if needed   Stop taking Plaquenil as directed by your Dr. Stop taking aspirin, Ibuprofen, Advil, Motrin. BC's, Goody's, Herbal medications, Fish Oil, Aleve  Do not wear jewelry, make-up or nail polish.  Do not wear lotions, powders, or perfumes.  You may wear deodorant.  Do not shave 48 hours prior to surgery.  Men may shave face and neck.  Do not bring valuables to the hospital.  [facility] is not [facility] for any belongings or valuables.  Contacts, dentures or bridgework may not be worn into surgery.  Leave your suitcase in the car.  After surgery it may be brought to your room.  For patients admitted to the hospital, discharge time will be determined by your treatment team.  Patients discharged the day of surgery will not be allowed to drive home.    Special instructions:  [facility] - Preparing for Surgery  Before surgery, you can play an important role.  Because skin is not sterile, your skin needs to be as free of germs as possible.  You can reduce the number of germs on you skin by washing with CHG (chlorahexidine gluconate) soap before surgery.  CHG is an antiseptic cleaner which kills germs and bonds with the skin to continue killing germs even after washing.  Please DO NOT use if you have an allergy to CHG or  antibacterial soaps.  If your skin becomes reddened/irritated stop using the CHG and inform your nurse when you arrive at Short Stay.  Do not shave (including legs and underarms) for at least 48 hours prior to the first CHG shower.  You may shave your face.  Please follow these instructions carefully:   1.  Shower with CHG Soap the night before surgery and the                                morning of Surgery.  2.  If you choose to wash your hair, wash your hair first as usual with your       normal shampoo.  3.  After you shampoo, rinse your hair and body thoroughly to remove the                      Shampoo.  4.  Use CHG as you would any other liquid soap.  You can apply chg directly       to the skin and wash gently with scrungie or a clean washcloth.  5.  Apply the CHG Soap to your body ONLY FROM THE NECK DOWN.        Do not use on open wounds or open sores.  Avoid contact with your eyes,       ears, mouth and genitals (private parts).  Wash genitals (private parts)       with your normal soap.  6.  Wash thoroughly, paying special attention to the area where your surgery        will be performed.  7.  Thoroughly rinse your body with warm water from the neck down.  8.  DO NOT shower/wash with your normal soap after using and rinsing off       the CHG Soap.  9.  Pat yourself dry with a clean towel.            10.  Wear clean pajamas.            11.  Place clean sheets on your bed the night of your first shower and do not        sleep with pets.  Day of Surgery  Do not apply any lotions/deoderants the morning of surgery.  Please wear clean clothes to the hospital/surgery center.     Please read over the following fact sheets that you were given. Pain Booklet, Coughing and Deep Breathing, MRSA Information and Surgical Site Infection Prevention

## 2015-06-14 MED ORDER — CEFAZOLIN SODIUM-DEXTROSE 2-4 GM/100ML-% IV SOLN
2.0000 g | INTRAVENOUS | Status: AC
  Administered 2015-06-15: 2 g via INTRAVENOUS
  Filled 2015-06-14: qty 100

## 2015-06-15 ENCOUNTER — Ambulatory Visit (HOSPITAL_COMMUNITY)
Admission: RE | Admit: 2015-06-15 | Discharge: 2015-06-15 | Disposition: A | Discharge status: Home / Self Care | Payer: Self-pay | Source: Ambulatory Visit | Attending: Orthopedic Surgery | Admitting: Orthopedic Surgery

## 2015-06-15 ENCOUNTER — Ambulatory Visit (HOSPITAL_COMMUNITY): Payer: Self-pay | Admitting: Anesthesiology

## 2015-06-15 ENCOUNTER — Encounter (HOSPITAL_COMMUNITY)
Admission: RE | Disposition: A | Discharge status: Home / Self Care | Payer: Self-pay | Source: Ambulatory Visit | Attending: Orthopedic Surgery

## 2015-06-15 ENCOUNTER — Encounter (HOSPITAL_COMMUNITY): Payer: Self-pay | Admitting: *Deleted

## 2015-06-15 DIAGNOSIS — M329 Systemic lupus erythematosus, unspecified: Secondary | ICD-10-CM | POA: Insufficient documentation

## 2015-06-15 DIAGNOSIS — M228X1 Other disorders of patella, right knee: Secondary | ICD-10-CM | POA: Insufficient documentation

## 2015-06-15 DIAGNOSIS — M2341 Loose body in knee, right knee: Secondary | ICD-10-CM | POA: Insufficient documentation

## 2015-06-15 DIAGNOSIS — F17200 Nicotine dependence, unspecified, uncomplicated: Secondary | ICD-10-CM | POA: Insufficient documentation

## 2015-06-15 DIAGNOSIS — I10 Essential (primary) hypertension: Secondary | ICD-10-CM | POA: Insufficient documentation

## 2015-06-15 DIAGNOSIS — X58XXXA Exposure to other specified factors, initial encounter: Secondary | ICD-10-CM | POA: Insufficient documentation

## 2015-06-15 DIAGNOSIS — M25361 Other instability, right knee: Secondary | ICD-10-CM | POA: Insufficient documentation

## 2015-06-15 DIAGNOSIS — S76111A Strain of right quadriceps muscle, fascia and tendon, initial encounter: Secondary | ICD-10-CM | POA: Insufficient documentation

## 2015-06-15 DIAGNOSIS — Z79899 Other long term (current) drug therapy: Secondary | ICD-10-CM | POA: Insufficient documentation

## 2015-06-15 DIAGNOSIS — S86811D Strain of other muscle(s) and tendon(s) at lower leg level, right leg, subsequent encounter: Secondary | ICD-10-CM

## 2015-06-15 HISTORY — PX: KNEE ARTHROSCOPY: SHX127

## 2015-06-15 HISTORY — PX: PATELLAR TENDON REPAIR: SHX737

## 2015-06-15 SURGERY — REPAIR, TENDON, PATELLAR
Anesthesia: Regional | Site: Knee | Laterality: Right

## 2015-06-15 MED ORDER — EPHEDRINE 5 MG/ML INJ
INTRAVENOUS | Status: AC
  Filled 2015-06-15: qty 10

## 2015-06-15 MED ORDER — ROCURONIUM BROMIDE 50 MG/5ML IV SOLN
INTRAVENOUS | Status: AC
  Filled 2015-06-15: qty 1

## 2015-06-15 MED ORDER — CHLORHEXIDINE GLUCONATE 4 % EX LIQD: 60.0000 mL | Freq: Once | CUTANEOUS | Status: DC

## 2015-06-15 MED ORDER — 0.9 % SODIUM CHLORIDE (POUR BTL) OPTIME
TOPICAL | Status: DC | PRN
  Administered 2015-06-15: 1000 mL

## 2015-06-15 MED ORDER — BUPIVACAINE HCL (PF) 0.25 % IJ SOLN
INTRAMUSCULAR | Status: AC
  Filled 2015-06-15: qty 30

## 2015-06-15 MED ORDER — ONDANSETRON HCL 4 MG/2ML IJ SOLN
INTRAMUSCULAR | Status: AC
  Filled 2015-06-15: qty 2

## 2015-06-15 MED ORDER — PROPOFOL 10 MG/ML IV BOLUS
INTRAVENOUS | Status: AC
  Filled 2015-06-15: qty 20

## 2015-06-15 MED ORDER — MORPHINE SULFATE (PF) 4 MG/ML IV SOLN
INTRAVENOUS | Status: DC | PRN
  Administered 2015-06-15 (×2): 4 mg via INTRAVENOUS

## 2015-06-15 MED ORDER — LIDOCAINE HCL (CARDIAC) 20 MG/ML IV SOLN
INTRAVENOUS | Status: DC | PRN
  Administered 2015-06-15: 60 mg via INTRAVENOUS

## 2015-06-15 MED ORDER — GLYCOPYRROLATE 0.2 MG/ML IV SOSY
PREFILLED_SYRINGE | INTRAVENOUS | Status: AC
  Filled 2015-06-15: qty 3

## 2015-06-15 MED ORDER — MORPHINE SULFATE (PF) 4 MG/ML IV SOLN
INTRAVENOUS | Status: AC
  Filled 2015-06-15: qty 1

## 2015-06-15 MED ORDER — LIDOCAINE 2% (20 MG/ML) 5 ML SYRINGE
INTRAMUSCULAR | Status: AC
  Filled 2015-06-15: qty 5

## 2015-06-15 MED ORDER — BUPIVACAINE-EPINEPHRINE (PF) 0.25% -1:200000 IJ SOLN
INTRAMUSCULAR | Status: AC
  Filled 2015-06-15: qty 30

## 2015-06-15 MED ORDER — HYDROMORPHONE HCL 1 MG/ML IJ SOLN
INTRAMUSCULAR | Status: AC
  Filled 2015-06-15: qty 1

## 2015-06-15 MED ORDER — SODIUM CHLORIDE 0.9 % IR SOLN
Status: DC | PRN
  Administered 2015-06-15 (×2): 3000 mL

## 2015-06-15 MED ORDER — FENTANYL CITRATE (PF) 250 MCG/5ML IJ SOLN
INTRAMUSCULAR | Status: DC | PRN
  Administered 2015-06-15 (×2): 100 ug via INTRAVENOUS
  Administered 2015-06-15 (×4): 50 ug via INTRAVENOUS
  Administered 2015-06-15: 100 ug via INTRAVENOUS

## 2015-06-15 MED ORDER — LACTATED RINGERS IV SOLN
INTRAVENOUS | Status: DC
  Administered 2015-06-15 (×2): via INTRAVENOUS

## 2015-06-15 MED ORDER — OXYCODONE-ACETAMINOPHEN 10-325 MG PO TABS: 1.0000 | ORAL_TABLET | Freq: Four times a day (QID) | ORAL | Status: DC | PRN

## 2015-06-15 MED ORDER — METHOCARBAMOL 500 MG PO TABS: 500.0000 mg | ORAL_TABLET | Freq: Four times a day (QID) | ORAL | Status: DC

## 2015-06-15 MED ORDER — ONDANSETRON HCL 4 MG/2ML IJ SOLN
INTRAMUSCULAR | Status: DC | PRN
  Administered 2015-06-15 (×2): 4 mg via INTRAVENOUS

## 2015-06-15 MED ORDER — ASPIRIN EC 325 MG PO TBEC: 325.0000 mg | DELAYED_RELEASE_TABLET | Freq: Every day | ORAL | Status: DC

## 2015-06-15 MED ORDER — HYDROMORPHONE HCL 1 MG/ML IJ SOLN
0.2500 mg | INTRAMUSCULAR | Status: DC | PRN
  Administered 2015-06-15 (×3): 0.5 mg via INTRAVENOUS

## 2015-06-15 MED ORDER — MIDAZOLAM HCL 2 MG/2ML IJ SOLN
INTRAMUSCULAR | Status: AC
  Filled 2015-06-15: qty 2

## 2015-06-15 MED ORDER — HYDROMORPHONE HCL 1 MG/ML IJ SOLN
INTRAMUSCULAR | Status: DC | PRN
Start: 2015-06-15 — End: 2015-06-15
  Administered 2015-06-15: .25 mg via INTRAVENOUS
  Administered 2015-06-15: 0.5 mg via INTRAVENOUS
  Administered 2015-06-15: .25 mg via INTRAVENOUS

## 2015-06-15 MED ORDER — BUPIVACAINE HCL (PF) 0.25 % IJ SOLN
INTRAMUSCULAR | Status: DC | PRN
  Administered 2015-06-15: 30 mL

## 2015-06-15 MED ORDER — PROPOFOL 10 MG/ML IV BOLUS
INTRAVENOUS | Status: DC | PRN
  Administered 2015-06-15: 200 mg via INTRAVENOUS

## 2015-06-15 MED ORDER — CLONIDINE HCL (ANALGESIA) 100 MCG/ML EP SOLN
150.0000 ug | Freq: Once | EPIDURAL | Status: AC
  Administered 2015-06-15: 150 ug via INTRA_ARTICULAR
  Filled 2015-06-15: qty 1.5

## 2015-06-15 MED ORDER — STERILE WATER FOR IRRIGATION IR SOLN
Status: DC | PRN
  Administered 2015-06-15: 100 mL

## 2015-06-15 MED ORDER — MIDAZOLAM HCL 5 MG/5ML IJ SOLN
INTRAMUSCULAR | Status: DC | PRN
  Administered 2015-06-15: 2 mg via INTRAVENOUS

## 2015-06-15 MED ORDER — MIDAZOLAM HCL 2 MG/2ML IJ SOLN
INTRAMUSCULAR | Status: AC
  Administered 2015-06-15: 2 mg
  Filled 2015-06-15: qty 2

## 2015-06-15 MED ORDER — FENTANYL CITRATE (PF) 100 MCG/2ML IJ SOLN
INTRAMUSCULAR | Status: AC
  Administered 2015-06-15: 100 ug
  Filled 2015-06-15: qty 2

## 2015-06-15 MED ORDER — ARTIFICIAL TEARS OP OINT
TOPICAL_OINTMENT | OPHTHALMIC | Status: AC
  Filled 2015-06-15: qty 3.5

## 2015-06-15 MED ORDER — EPHEDRINE SULFATE 50 MG/ML IJ SOLN
INTRAMUSCULAR | Status: DC | PRN
  Administered 2015-06-15: 10 mg via INTRAVENOUS

## 2015-06-15 MED ORDER — FENTANYL CITRATE (PF) 250 MCG/5ML IJ SOLN
INTRAMUSCULAR | Status: AC
  Filled 2015-06-15: qty 5

## 2015-06-15 MED ORDER — HYDROMORPHONE HCL 1 MG/ML IJ SOLN
INTRAMUSCULAR | Status: AC
  Administered 2015-06-15: 0.5 mg via INTRAVENOUS
  Filled 2015-06-15: qty 1

## 2015-06-15 SURGICAL SUPPLY — 95 items
BANDAGE ACE 4X5 VEL STRL LF (GAUZE/BANDAGES/DRESSINGS) ×3 IMPLANT
BANDAGE ACE 6X5 VEL STRL LF (GAUZE/BANDAGES/DRESSINGS) ×3 IMPLANT
BANDAGE ELASTIC 6 VELCRO ST LF (GAUZE/BANDAGES/DRESSINGS) IMPLANT
BANDAGE ESMARK 6X9 LF (GAUZE/BANDAGES/DRESSINGS) ×1 IMPLANT
BIT DRILL 7/64X5 DISP (BIT) ×3 IMPLANT
BLADE CUDA 5.5 (BLADE) IMPLANT
BLADE GREAT WHITE 4.2 (BLADE) ×2 IMPLANT
BLADE GREAT WHITE 4.2MM (BLADE) ×1
BLADE SURG 10 STRL SS (BLADE) ×9 IMPLANT
BLADE SURG 15 STRL LF DISP TIS (BLADE) ×2 IMPLANT
BLADE SURG 15 STRL SS (BLADE) ×4
BLADE SURG ROTATE 9660 (MISCELLANEOUS) ×3 IMPLANT
BNDG COHESIVE 4X5 TAN STRL (GAUZE/BANDAGES/DRESSINGS) ×3 IMPLANT
BNDG ESMARK 6X9 LF (GAUZE/BANDAGES/DRESSINGS) ×3
BNDG GAUZE ELAST 4 BULKY (GAUZE/BANDAGES/DRESSINGS) IMPLANT
BUR OVAL 6.0 (BURR) IMPLANT
CLOSURE WOUND 1/2 X4 (GAUZE/BANDAGES/DRESSINGS) ×1
COVER MAYO STAND STRL (DRAPES) ×3 IMPLANT
COVER SURGICAL LIGHT HANDLE (MISCELLANEOUS) ×3 IMPLANT
CUFF TOURNIQUET SINGLE 34IN LL (TOURNIQUET CUFF) ×3 IMPLANT
CUFF TOURNIQUET SINGLE 44IN (TOURNIQUET CUFF) IMPLANT
DRAPE ARTHROSCOPY W/POUCH 114 (DRAPES) ×3 IMPLANT
DRAPE INCISE IOBAN 66X45 STRL (DRAPES) ×3 IMPLANT
DRAPE PROXIMA HALF (DRAPES) IMPLANT
DRAPE U-SHAPE 47X51 STRL (DRAPES) ×3 IMPLANT
DRSG ADAPTIC 3X8 NADH LF (GAUZE/BANDAGES/DRESSINGS) IMPLANT
DRSG AQUACEL AG ADV 3.5X10 (GAUZE/BANDAGES/DRESSINGS) ×3 IMPLANT
DRSG PAD ABDOMINAL 8X10 ST (GAUZE/BANDAGES/DRESSINGS) IMPLANT
DURAPREP 26ML APPLICATOR (WOUND CARE) ×3 IMPLANT
ELECT REM PT RETURN 9FT ADLT (ELECTROSURGICAL) ×3
ELECTRODE REM PT RTRN 9FT ADLT (ELECTROSURGICAL) ×1 IMPLANT
GAUZE SPONGE 4X4 12PLY STRL (GAUZE/BANDAGES/DRESSINGS) IMPLANT
GAUZE XEROFORM 1X8 LF (GAUZE/BANDAGES/DRESSINGS) ×3 IMPLANT
GAUZE XEROFORM 5X9 LF (GAUZE/BANDAGES/DRESSINGS) IMPLANT
GLOVE BIOGEL PI IND STRL 7.0 (GLOVE) ×1 IMPLANT
GLOVE BIOGEL PI IND STRL 8 (GLOVE) ×2 IMPLANT
GLOVE BIOGEL PI INDICATOR 7.0 (GLOVE) ×2
GLOVE BIOGEL PI INDICATOR 8 (GLOVE) ×4
GLOVE SURG ORTHO 8.0 STRL STRW (GLOVE) ×6 IMPLANT
GLOVE SURG SS PI 6.5 STRL IVOR (GLOVE) ×3 IMPLANT
GLOVE SURG SS PI 7.0 STRL IVOR (GLOVE) ×3 IMPLANT
GLOVE SURG SS PI 8.0 STRL IVOR (GLOVE) ×9 IMPLANT
GOWN EXTRA PROTECTION XXL 0583 (GOWNS) ×6 IMPLANT
GOWN STRL REUS W/ TWL LRG LVL3 (GOWN DISPOSABLE) ×2 IMPLANT
GOWN STRL REUS W/ TWL XL LVL3 (GOWN DISPOSABLE) ×1 IMPLANT
GOWN STRL REUS W/TWL LRG LVL3 (GOWN DISPOSABLE) ×4
GOWN STRL REUS W/TWL XL LVL3 (GOWN DISPOSABLE) ×2
IMMOBILIZER KNEE 22 UNIV (SOFTGOODS) IMPLANT
KIT BASIN OR (CUSTOM PROCEDURE TRAY) ×3 IMPLANT
KIT ROOM TURNOVER OR (KITS) ×3 IMPLANT
MANIFOLD NEPTUNE II (INSTRUMENTS) ×3 IMPLANT
NDL SUT 6 .5 CRC .975X.05 MAYO (NEEDLE) ×1 IMPLANT
NEEDLE 18GX1X1/2 (RX/OR ONLY) (NEEDLE) ×3 IMPLANT
NEEDLE 22X1 1/2 (OR ONLY) (NEEDLE) ×3 IMPLANT
NEEDLE HYPO 25GX1X1/2 BEV (NEEDLE) ×3 IMPLANT
NEEDLE MAYO TAPER (NEEDLE) ×2
NEEDLE MAYO TROCAR (NEEDLE) ×3 IMPLANT
NS IRRIG 1000ML POUR BTL (IV SOLUTION) ×3 IMPLANT
PACK ARTHROSCOPY DSU (CUSTOM PROCEDURE TRAY) ×3 IMPLANT
PACK ORTHO EXTREMITY (CUSTOM PROCEDURE TRAY) IMPLANT
PAD ARMBOARD 7.5X6 YLW CONV (MISCELLANEOUS) ×3 IMPLANT
PADDING CAST ABS 4INX4YD NS (CAST SUPPLIES) ×2
PADDING CAST ABS 6INX4YD NS (CAST SUPPLIES) ×2
PADDING CAST ABS COTTON 4X4 ST (CAST SUPPLIES) ×1 IMPLANT
PADDING CAST ABS COTTON 6X4 NS (CAST SUPPLIES) ×1 IMPLANT
PADDING CAST COTTON 6X4 STRL (CAST SUPPLIES) IMPLANT
PASSER SUT SWANSON 36MM LOOP (INSTRUMENTS) ×3 IMPLANT
PENCIL BUTTON HOLSTER BLD 10FT (ELECTRODE) ×3 IMPLANT
SET ARTHROSCOPY TUBING (MISCELLANEOUS) ×2
SET ARTHROSCOPY TUBING LN (MISCELLANEOUS) ×1 IMPLANT
SPONGE LAP 18X18 X RAY DECT (DISPOSABLE) ×9 IMPLANT
SPONGE LAP 4X18 X RAY DECT (DISPOSABLE) ×3 IMPLANT
STAPLER VISISTAT 35W (STAPLE) ×3 IMPLANT
STOCKINETTE IMPERVIOUS 9X36 MD (GAUZE/BANDAGES/DRESSINGS) ×3 IMPLANT
STRIP CLOSURE SKIN 1/2X4 (GAUZE/BANDAGES/DRESSINGS) ×2 IMPLANT
SUT ETHILON 3 0 PS 1 (SUTURE) ×3 IMPLANT
SUT FIBERWIRE #2 38 T-5 BLUE (SUTURE) ×6
SUT MENISCAL KIT (KITS) IMPLANT
SUT MNCRL AB 4-0 PS2 18 (SUTURE) ×3 IMPLANT
SUT VIC AB 0 CT1 27 (SUTURE) ×10
SUT VIC AB 0 CT1 27XBRD ANBCTR (SUTURE) ×5 IMPLANT
SUT VIC AB 2-0 CT1 27 (SUTURE) ×6
SUT VIC AB 2-0 CT1 TAPERPNT 27 (SUTURE) ×3 IMPLANT
SUTURE FIBERWR #2 38 T-5 BLUE (SUTURE) ×2 IMPLANT
SYR 20ML ECCENTRIC (SYRINGE) ×3 IMPLANT
SYR CONTROL 10ML LL (SYRINGE) ×3 IMPLANT
SYR TB 1ML LUER SLIP (SYRINGE) ×3 IMPLANT
TOWEL OR 17X24 6PK STRL BLUE (TOWEL DISPOSABLE) ×3 IMPLANT
TOWEL OR 17X26 10 PK STRL BLUE (TOWEL DISPOSABLE) ×3 IMPLANT
TUBE CONNECTING 12'X1/4 (SUCTIONS) ×1
TUBE CONNECTING 12X1/4 (SUCTIONS) ×2 IMPLANT
UNDERPAD 30X30 INCONTINENT (UNDERPADS AND DIAPERS) IMPLANT
WAND HAND CNTRL MULTIVAC 90 (MISCELLANEOUS) ×3 IMPLANT
WATER STERILE IRR 1000ML POUR (IV SOLUTION) ×3 IMPLANT
YANKAUER SUCT BULB TIP NO VENT (SUCTIONS) ×3 IMPLANT

## 2015-06-15 NOTE — Anesthesia Preprocedure Evaluation (Signed)
Anesthesia Evaluation  Patient identified by MRN, date of birth, ID band Patient awake    Reviewed: Allergy & Precautions, NPO status , Patient's Chart, lab work & pertinent test results  Airway Mallampati: II  TM Distance: >3 FB Neck ROM: Full    Dental  (+) Teeth Intact, Dental Advisory Given   Pulmonary Current Smoker,    breath sounds clear to auscultation       Cardiovascular hypertension,  Rhythm:Regular Rate:Normal     Neuro/Psych    GI/Hepatic   Endo/Other    Renal/GU      Musculoskeletal   Abdominal   Peds  Hematology   Anesthesia Other Findings   Reproductive/Obstetrics                             Anesthesia Physical Anesthesia Plan  ASA: III  Anesthesia Plan: General and Regional   Post-op Pain Management:    Induction: Intravenous  Airway Management Planned: LMA  Additional Equipment:   Intra-op Plan:   Post-operative Plan: Extubation in OR  Informed Consent: I have reviewed the patients History and Physical, chart, labs and discussed the procedure including the risks, benefits and alternatives for the proposed anesthesia with the patient or authorized representative who has indicated his/her understanding and acceptance.   Dental advisory given  Plan Discussed with: Anesthesiologist  Anesthesia Plan Comments:         Anesthesia Quick Evaluation

## 2015-06-15 NOTE — Anesthesia Postprocedure Evaluation (Signed)
Anesthesia Post Note  Patient: Ray Davis  Procedure(s) Performed: Procedure(s) (LRB): RIGHT KNEE ARTHROSCOPY, REMOVAL OF LOOSE BODY, MEDIAL REEFING, PATELLA TENDON REPAIR (Right) ARTHROSCOPY KNEE (Right)  Patient location during evaluation: PACU Anesthesia Type: General Level of consciousness: awake and alert and patient cooperative Pain management: pain level controlled Vital Signs Assessment: post-procedure vital signs reviewed and stable Respiratory status: spontaneous breathing and respiratory function stable Cardiovascular status: stable Anesthetic complications: no    Last Vitals:  Filed Vitals:   06/15/15 1702 06/15/15 1715  BP: 147/98   Pulse: 64 66  Temp:  36 C  Resp: 13 16    Last Pain:  Filed Vitals:   06/15/15 1717  PainSc: Asleep                 Xsavier Seeley S

## 2015-06-15 NOTE — Transfer of Care (Signed)
Immediate Anesthesia Transfer of Care Note  Patient: Ray Davis  Procedure(s) Performed: Procedure(s) with comments: RIGHT KNEE ARTHROSCOPY, REMOVAL OF LOOSE BODY, MEDIAL REEFING, PATELLA TENDON REPAIR (Right) - Needs RNFA ARTHROSCOPY KNEE (Right)  Patient Location: PACU  Anesthesia Type:GA combined with regional for post-op pain  Level of Consciousness: awake, oriented, sedated, patient cooperative and responds to stimulation  Airway & Oxygen Therapy: Patient Spontanous Breathing and Patient connected to nasal cannula oxygen  Post-op Assessment: Report given to RN, Post -op Vital signs reviewed and stable, Patient moving all extremities and Patient moving all extremities X 4  Post vital signs: Reviewed and stable  Last Vitals:  Filed Vitals:   06/15/15 1215 06/15/15 1221  BP: 139/94   Pulse: 75 71  Temp:    Resp: 16 23    Last Pain: There were no vitals filed for this visit.       Complications: No apparent anesthesia complications
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
## 2015-06-15 NOTE — Op Note (Signed)
NAME:  Ray Davis, Ray Davis                ACCOUNT NO.:  [id number]  MEDICAL RECORD NO.:  [id number]  LOCATION:  [facility]                         FACILITY:  [facility]  PHYSICIAN:  Burnard BuntingG. Scott Dean, M.D.    DATE OF BIRTH:  1976-01-31  DATE OF PROCEDURE: DATE OF DISCHARGE:  06/15/2015                              OPERATIVE REPORT   PREOPERATIVE DIAGNOSIS:  Right chronic patellar tendon rupture, patella alta, loose body, patellar instability.  POSTOPERATIVE DIAGNOSIS:  Right chronic patellar tendon rupture, patella alta, loose body, patellar instability.  PROCEDURES:  Right knee arthroscopy, removal of loose body, open chronic patellar tendon rupture repair, lateral patellar tendon release, medial retinacular reefing.  SURGEON:  Burnard BuntingG. Scott Dean, M.D.  ASSISTANT:  Melvyn NethJustin Clean, RNFA.  INDICATIONS:  Ray Davis is a 40 year old patient over a year out from right knee injury.  He has pain and locking in the knee.  He presents now for operative management after explanation of the risks and benefits.  PROCEDURE IN DETAIL:  The patient was brought to the operating room where general anesthetic was induced.  Preoperative antibiotics were administered.  Time-out was called.  Right leg was prescrubbed with alcohol and Betadine, allowed to air dry, prepped with DuraPrep solution and draped in a sterile manner.  [name] was used to cover the operative field.  Leg was elevated and exsanguinated with Esmarch wrap. Tourniquet was inflated.  Portals were anesthetized with about 7 mL each of Marcaine with epinephrine.  Anterior inferolateral portals were established.  Anterior inferomedial portals were established under direct visualization.  Diagnostic arthroscopy was performed. Preoperatively, the patient had excellent range of motion with exam under anesthesia, but did have palpable gap with flexion between the inferior pole of patella and the palpable end of the tendon.  At this time, the patient's further  examination under anesthesia demonstrated intact ACL, PCL and collateral ligaments.  Patellar mobility was about 2 cm medial, 2 cm lateral, could not really dislocate the patella laterally with the knee in extension, but there was some laxity present. At this time, anterior inferolateral and anterior inferomedial portals were established under direct visualization and diagnostic arthroscopy was performed.  The patient had generally intact articular surfaces on the medial and lateral side, but with a loose body in the medial compartment, which was removed.  Loose body measured about 7 x 7 mm. The patient did have some early wear in the landing zone of the trochlea as well as on the medial facet of the patella, but it was not particularly, it was only grade 1.  There was a pseudo loose body adjacent to the lateral aspect of the patella, but this was actually within the tissues and not actually within the capsule.  It was palpable on the skin side of the knee.  Following removal of loose body, instruments were removed.  Anterior approach was made to the knee.  Skin and subcutaneous tissue were sharply divided.  The patellar tendon was mobilized from underneath the fat pad and surrounding retinacular structures, there was about a 2.5-cm gap between the tendon edge and inferior pole of patella, which I filled in the scar, this was excised. Inferior pole of patella  was freshened with a knife and rongeur.  At this time, two #2 FiberWire sutures were placed in Kessler grasping fashion.  Three drill holes were drilled in the patella.  The drill holes were centered about 3 mm medial in order to effect somewhat of a medial pull on the patella.  With the knee in full extension, the ends of the patella reached the inferior pole of the patella and they were tied.  Lateral retinacular release performed up to about 10 o'clock position on the patella.  Medial soft tissue reefing was then  performed medially.  This allowed for excellent patellar tracking with the knee flexed.  The tendon was then oversewn at its inferior pole of patella using 0 Vicryl suture.  Tourniquet released at this time.  Bleeding points were encountered and controlled using electrocautery.  The incision was then closed using 0 Vicryl suture, 2-0 suture, and a running 3-0 Monocryl and a 4-0 Monocryl.  Solution of Marcaine, clonidine and epinephrine injected into the skin and soft tissue for postop pain relief.  Thorough irrigation performed before closure, during closure using about 3 liters of irrigating solution.  Steri- Strips applied.  Knee immobilizer placed.  The patient will be allowed to be weightbearing as tolerated, but with the knee in full extension.     Burnard Bunting, M.D.     GSD/MEDQ  D:  06/15/2015  T:  06/15/2015  Job:  [id number]

## 2015-06-15 NOTE — Brief Op Note (Signed)
06/15/2015  3:49 PM  PATIENT:  Ray Davis  40 y.o. male  PRE-OPERATIVE DIAGNOSIS:  Right knee patella alta, chronic patella tendon rupture, loose body  POST-OPERATIVE DIAGNOSIS:  Right knee patella alta, chronic patella tendon rupture, loose body  PROCEDURE:  Procedure(s): RIGHT KNEE ARTHROSCOPY, REMOVAL OF LOOSE BODY, MEDIAL REEFING, PATELLA TENDON REPAIR ARTHROSCOPY KNEE  SURGEON:  Surgeon(s): Cammy CopaScott Gregory Dean, MD  ASSISTANT: Ray Davis RNFA  ANESTHESIA:   general  EBL: 50 ml    Total I/O In: 1000 [I.V.:1000] Out: -   BLOOD ADMINISTERED: none  DRAINS: none   LOCAL MEDICATIONS USED:  Marcaine mso4 clonidine  SPECIMEN:  No Specimen  COUNTS:  YES  TOURNIQUET:   Total Tourniquet Time Documented: Thigh (Left) - 88 minutes Total: Thigh (Left) - 88 minutes   DICTATION: .Other Dictation: Dictation Number [id number]  PLAN OF CARE: Discharge to home after PACU  PATIENT DISPOSITION:  PACU - hemodynamically stable

## 2015-06-15 NOTE — Anesthesia Procedure Notes (Signed)
Procedure Name: LMA Insertion Date/Time: 06/15/2015 1:39 PM Performed by: Salomon MastWALL, Rylynn Schoneman COREY Pre-anesthesia Checklist: Patient identified, Emergency Drugs available, Suction available and Patient being monitored Patient Re-evaluated:Patient Re-evaluated prior to inductionOxygen Delivery Method: Circle system utilized Preoxygenation: Pre-oxygenation with 100% oxygen Intubation Type: IV induction Ventilation: Oral airway inserted - appropriate to patient size LMA: LMA inserted LMA Size: 5.0 Number of attempts: 1 Placement Confirmation: positive ETCO2 Tube secured with: Tape Dental Injury: Teeth and Oropharynx as per pre-operative assessment

## 2015-06-16 ENCOUNTER — Encounter (HOSPITAL_COMMUNITY): Payer: Self-pay | Admitting: Orthopedic Surgery

## 2015-07-24 ENCOUNTER — Other Ambulatory Visit: Payer: Self-pay | Admitting: Family Medicine

## 2015-08-06 ENCOUNTER — Other Ambulatory Visit: Payer: Self-pay | Admitting: Family Medicine

## 2015-10-18 ENCOUNTER — Other Ambulatory Visit: Payer: Self-pay | Admitting: Family Medicine

## 2015-12-04 ENCOUNTER — Encounter (HOSPITAL_COMMUNITY): Payer: Self-pay | Admitting: Emergency Medicine

## 2015-12-04 ENCOUNTER — Ambulatory Visit (HOSPITAL_COMMUNITY)
Admission: EM | Admit: 2015-12-04 | Discharge: 2015-12-04 | Disposition: A | Discharge status: Home / Self Care | Payer: Self-pay | Attending: Emergency Medicine | Admitting: Emergency Medicine

## 2015-12-04 DIAGNOSIS — I10 Essential (primary) hypertension: Secondary | ICD-10-CM

## 2015-12-04 DIAGNOSIS — G894 Chronic pain syndrome: Secondary | ICD-10-CM

## 2015-12-04 DIAGNOSIS — M329 Systemic lupus erythematosus, unspecified: Secondary | ICD-10-CM

## 2015-12-04 DIAGNOSIS — Z760 Encounter for issue of repeat prescription: Secondary | ICD-10-CM

## 2015-12-04 MED ORDER — HYDROXYCHLOROQUINE SULFATE 200 MG PO TABS: 200.0000 mg | ORAL_TABLET | Freq: Every day | ORAL | 0 refills | Status: DC

## 2015-12-04 MED ORDER — PREDNISONE 20 MG PO TABS: ORAL_TABLET | ORAL | 0 refills | Status: DC

## 2015-12-04 MED ORDER — METHYLPREDNISOLONE ACETATE 40 MG/ML IJ SUSP
INTRAMUSCULAR | Status: AC
  Filled 2015-12-04: qty 1

## 2015-12-04 MED ORDER — DEXAMETHASONE SODIUM PHOSPHATE 10 MG/ML IJ SOLN
10.0000 mg | Freq: Once | INTRAMUSCULAR | Status: AC
  Administered 2015-12-04: 10 mg via INTRAMUSCULAR

## 2015-12-04 MED ORDER — DEXAMETHASONE SODIUM PHOSPHATE 10 MG/ML IJ SOLN
INTRAMUSCULAR | Status: AC
  Filled 2015-12-04: qty 1

## 2015-12-04 MED ORDER — METHYLPREDNISOLONE ACETATE 40 MG/ML IJ SUSP
40.0000 mg | Freq: Once | INTRAMUSCULAR | Status: AC
  Administered 2015-12-04: 40 mg via INTRAMUSCULAR
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
## 2015-12-04 NOTE — ED Triage Notes (Signed)
Patient has general pain and weakness thought to be related to lupus.  Patient reports she has been out of routine medicines for a month.  Patient missed an appt with dr dean last month.  Patient has pending disability, and expired orange card.

## 2015-12-21 ENCOUNTER — Other Ambulatory Visit: Payer: Self-pay | Admitting: Family Medicine

## 2015-12-25 ENCOUNTER — Other Ambulatory Visit: Payer: Self-pay | Admitting: Family Medicine

## 2016-02-05 ENCOUNTER — Other Ambulatory Visit: Payer: Self-pay | Admitting: Family Medicine

## 2016-02-15 ENCOUNTER — Encounter (HOSPITAL_COMMUNITY): Payer: Self-pay | Admitting: Emergency Medicine

## 2016-02-15 ENCOUNTER — Emergency Department (HOSPITAL_COMMUNITY)
Admission: EM | Admit: 2016-02-15 | Discharge: 2016-02-15 | Disposition: A | Discharge status: Home / Self Care | Payer: Medicaid Other | Attending: Emergency Medicine | Admitting: Emergency Medicine

## 2016-02-15 DIAGNOSIS — Z7982 Long term (current) use of aspirin: Secondary | ICD-10-CM | POA: Diagnosis not present

## 2016-02-15 DIAGNOSIS — I10 Essential (primary) hypertension: Secondary | ICD-10-CM | POA: Diagnosis not present

## 2016-02-15 DIAGNOSIS — M130 Polyarthritis, unspecified: Secondary | ICD-10-CM | POA: Insufficient documentation

## 2016-02-15 DIAGNOSIS — M2550 Pain in unspecified joint: Secondary | ICD-10-CM | POA: Diagnosis present

## 2016-02-15 DIAGNOSIS — Z79899 Other long term (current) drug therapy: Secondary | ICD-10-CM | POA: Insufficient documentation

## 2016-02-15 LAB — URINALYSIS, ROUTINE W REFLEX MICROSCOPIC
BILIRUBIN URINE: NEGATIVE
GLUCOSE, UA: NEGATIVE mg/dL
HGB URINE DIPSTICK: NEGATIVE
KETONES UR: NEGATIVE mg/dL
LEUKOCYTES UA: NEGATIVE
Nitrite: NEGATIVE
PH: 6 (ref 5.0–8.0)
PROTEIN: NEGATIVE mg/dL
Specific Gravity, Urine: 1.002 — ABNORMAL LOW (ref 1.005–1.030)

## 2016-02-15 LAB — BASIC METABOLIC PANEL
ANION GAP: 8 (ref 5–15)
BUN: 11 mg/dL (ref 6–20)
CALCIUM: 9.5 mg/dL (ref 8.9–10.3)
CO2: 22 mmol/L (ref 22–32)
CREATININE: 0.93 mg/dL (ref 0.61–1.24)
Chloride: 106 mmol/L (ref 101–111)
GFR calc Af Amer: >60 mL/min (ref >60)
GLUCOSE: 89 mg/dL (ref 65–99)
Potassium: 4.2 mmol/L (ref 3.5–5.1)
Sodium: 136 mmol/L (ref 135–145)

## 2016-02-15 LAB — CBC
HEMATOCRIT: 40.9 % (ref 39.0–52.0)
Hemoglobin: 14.1 g/dL (ref 13.0–17.0)
MCH: 28.3 pg (ref 26.0–34.0)
MCHC: 34.5 g/dL (ref 30.0–36.0)
MCV: 82 fL (ref 78.0–100.0)
PLATELETS: 270 10*3/uL (ref 150–400)
RBC: 4.99 MIL/uL (ref 4.22–5.81)
RDW: 13.7 % (ref 11.5–15.5)
WBC: 3.8 10*3/uL — AB (ref 4.0–10.5)

## 2016-02-15 MED ORDER — HYDROXYCHLOROQUINE SULFATE 200 MG PO TABS: 200.0000 mg | ORAL_TABLET | Freq: Every day | ORAL | 0 refills | Status: AC

## 2016-02-15 MED ORDER — OXYCODONE-ACETAMINOPHEN 5-325 MG PO TABS
1.0000 | ORAL_TABLET | ORAL | Status: DC | PRN
  Administered 2016-02-15: 1 via ORAL

## 2016-02-15 MED ORDER — OXYCODONE-ACETAMINOPHEN 5-325 MG PO TABS
ORAL_TABLET | ORAL | Status: AC
  Filled 2016-02-15: qty 1

## 2016-02-15 MED ORDER — ACETAMINOPHEN 500 MG PO TABS: 1000.0000 mg | ORAL_TABLET | Freq: Three times a day (TID) | ORAL | 0 refills | Status: AC

## 2016-02-15 MED ORDER — PREDNISONE 20 MG PO TABS: ORAL_TABLET | ORAL | 0 refills | Status: DC

## 2016-02-15 NOTE — ED Triage Notes (Signed)
Pt states for the last few weeks he has had increasing joint pain all over his body with a history of lupus. Pt reports being off all meds including bp meds for the last "at least 4 months".

## 2016-02-15 NOTE — ED Notes (Signed)
Pt stable, understands discharge instructions, and reasons for return.   

## 2016-02-15 NOTE — Care Management (Signed)
CM met with patient at bedside, he reports he was recently approved for Medicaid, he does not have the card with him. CM explained on the card is his assigned PCP, contact information, and he will need to call to schedule a follow up appointment as soon as possible. Patient verbalized understanding and teach back done. No further CM needs identifed

## 2016-02-15 NOTE — ED Notes (Signed)
Pt c/o generalized body aches and pain due to lupus flair.
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
## 2016-03-15 IMAGING — MR MR KNEE*R* W/O CM
4 of 6 series · 19 of 40 positions shown · non-contrast
Comparison: Radiographs 05/18/2014

CLINICAL DATA: Patellar tendon rupture.

EXAM:
MRI OF THE RIGHT KNEE WITHOUT CONTRAST
TECHNIQUE: Multiplanar, multisequence MR imaging of the knee was performed. No
intravenous contrast was administered.

[Series 3: PD fat-sat · coronal · 4.0mm · 0.31mm/px · 8 of 34 slices shown (1 of 4)]
[im 1/34]
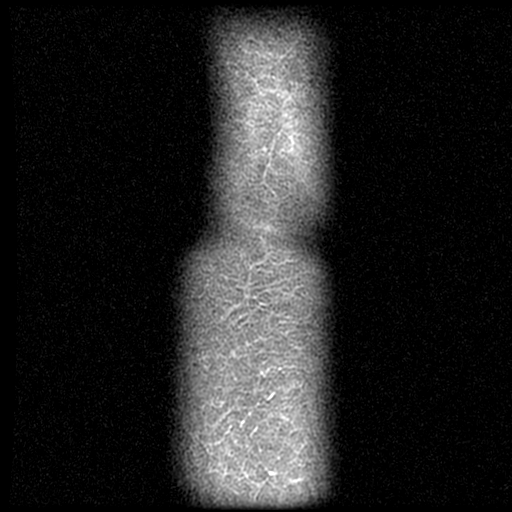
[im 5/34]
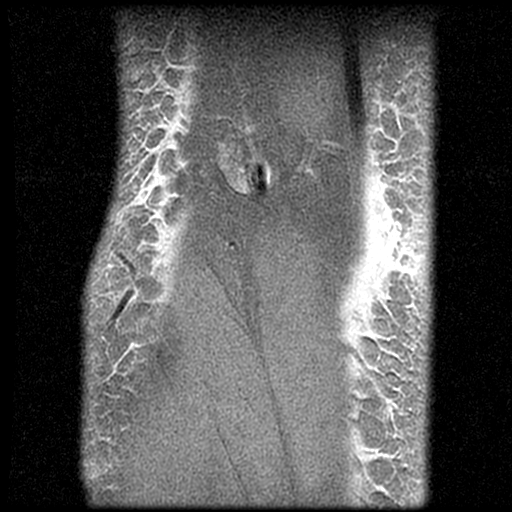
[im 10/34]
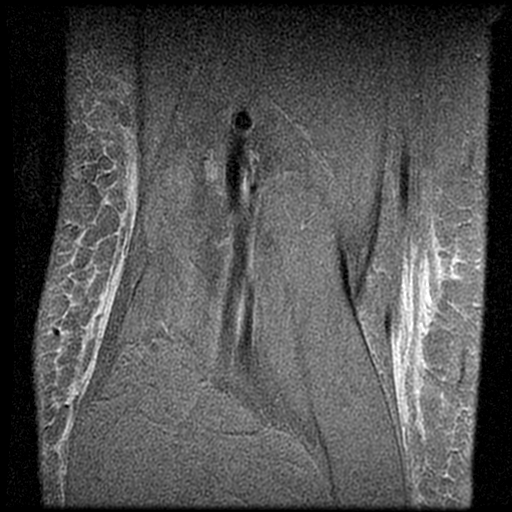
[im 15/34]
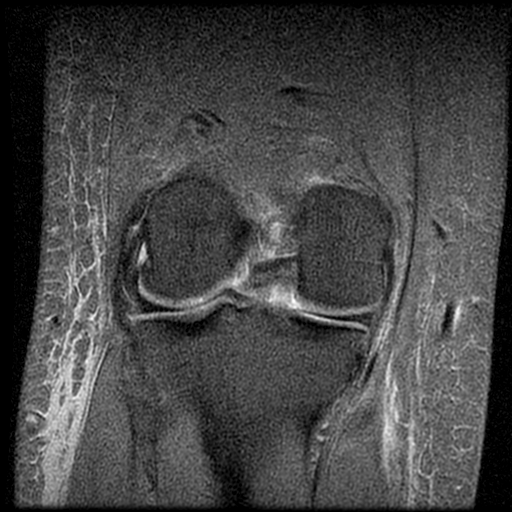
[im 19/34]
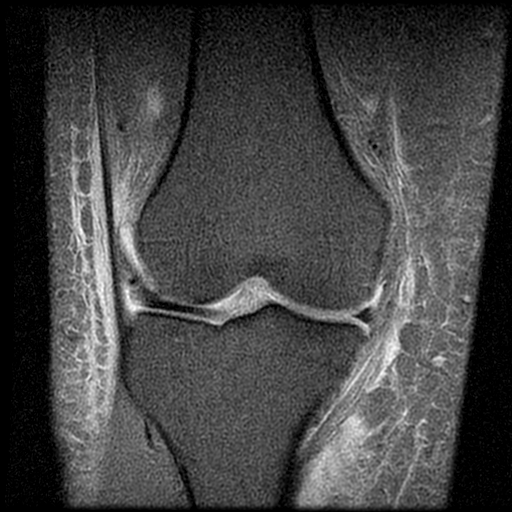
[im 24/34]
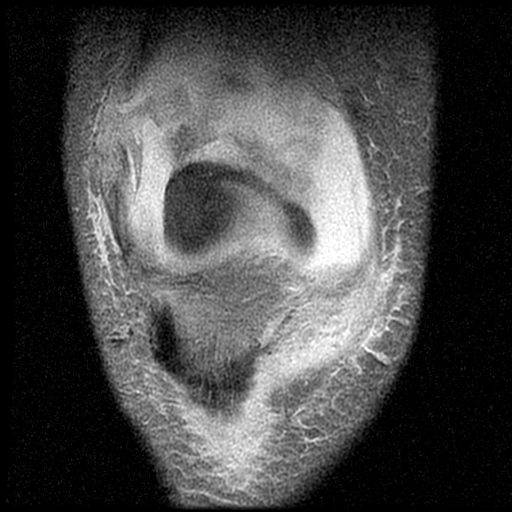
[im 29/34]
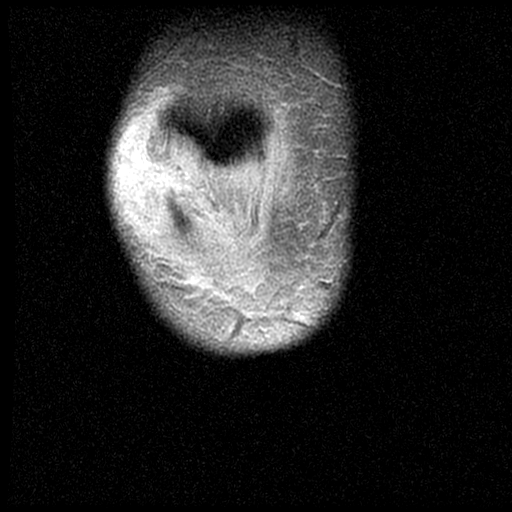
[im 34/34]
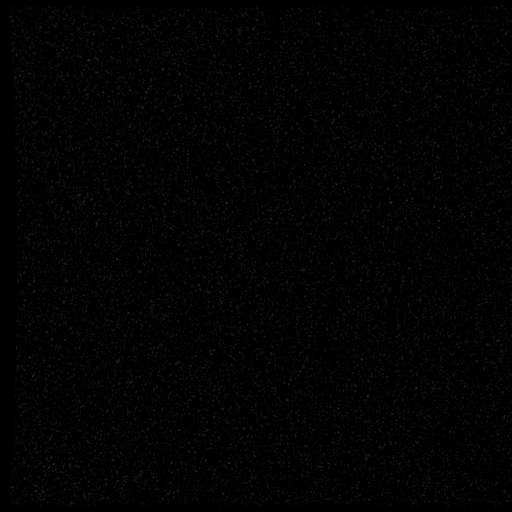

[Series 6: PD fat-sat · sagittal · 4.0mm · 0.31mm/px · 5 of 29 slices shown (2 of 4)]
[im 1/29]
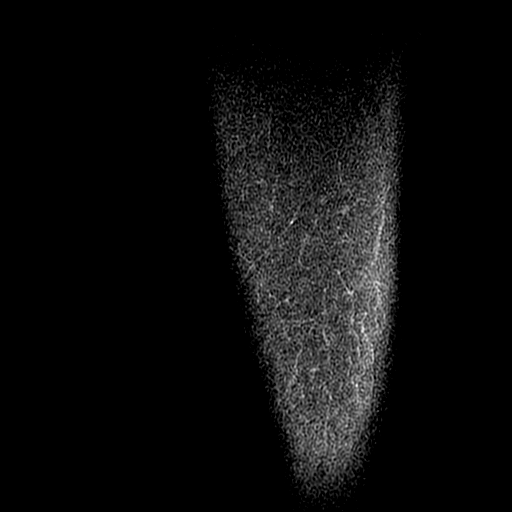
[im 6/29]
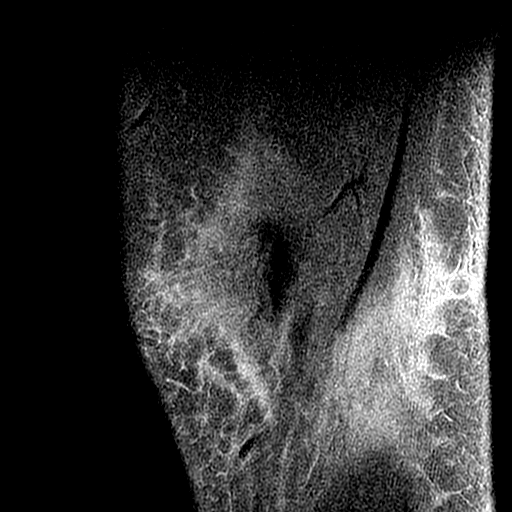
[im 12/29]
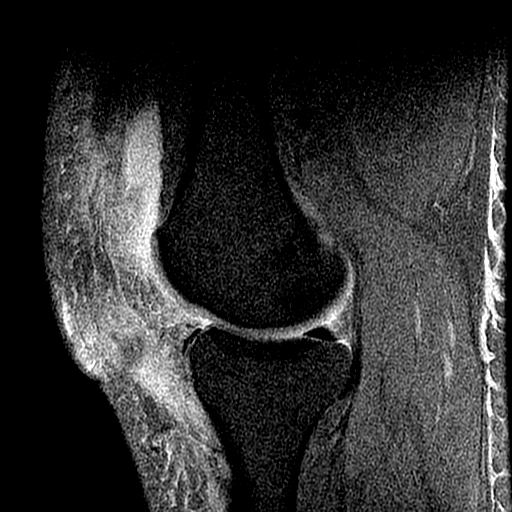
[im 17/29]
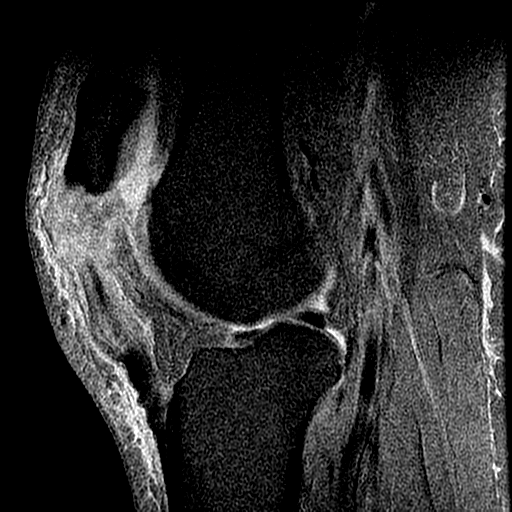
[im 29/29]
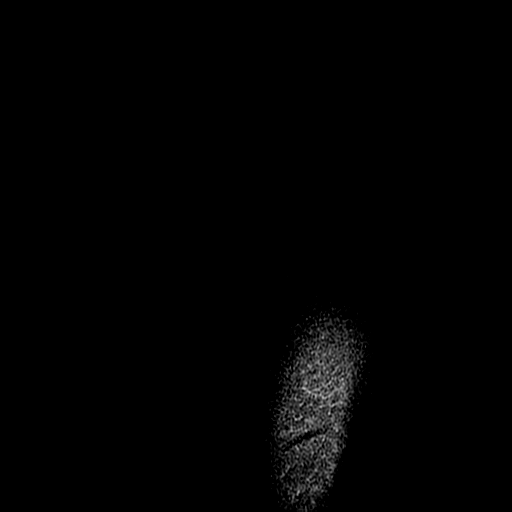

[Series 7: PD fat-sat · axial · 4.0mm · 0.29mm/px · z∈[-35,+70]mm · 3 of 33 slices shown (3 of 4)]
[im 6/33]
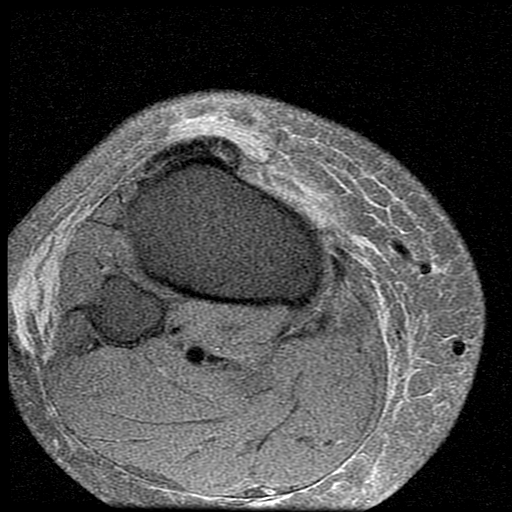
[im 17/33]
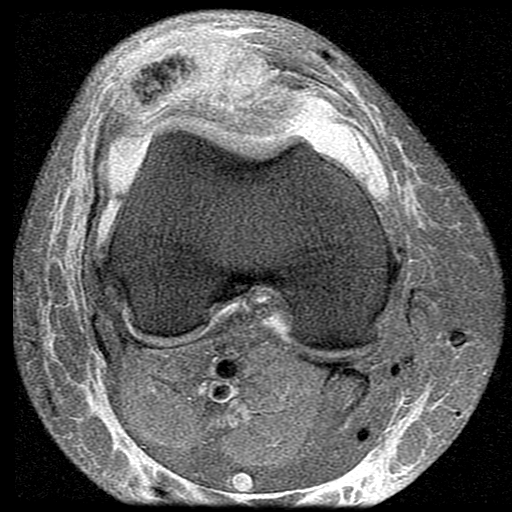
[im 27/33]
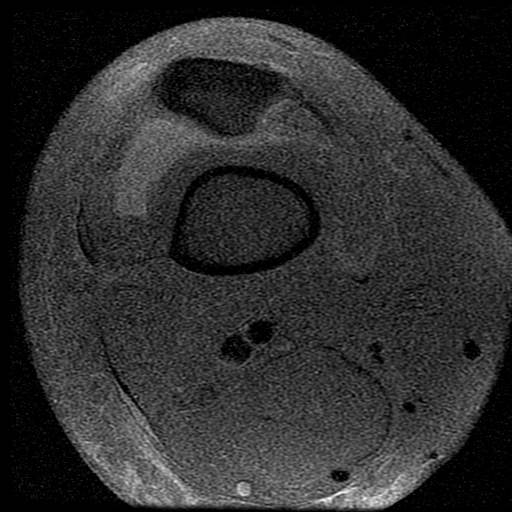

[Series 8: PD fat-sat · coronal · 2.0mm · 0.31mm/px · 3 of 22 slices shown (4 of 4)]
[im 1/22]
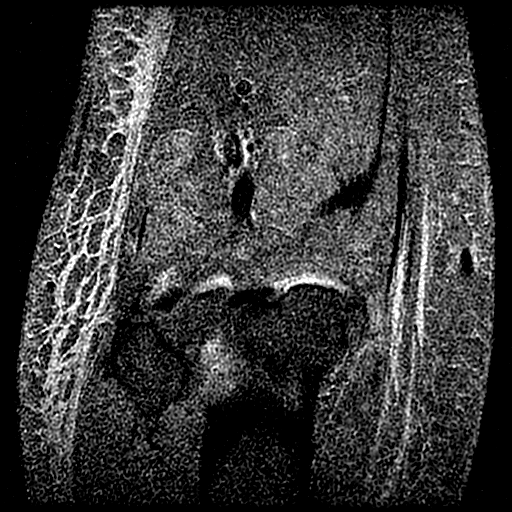
[im 11/22]
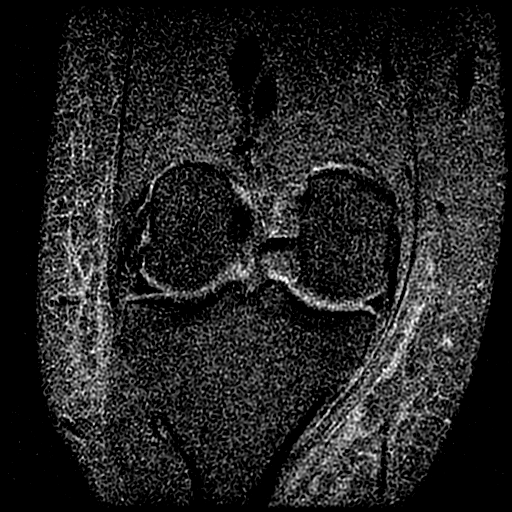
[im 22/22]
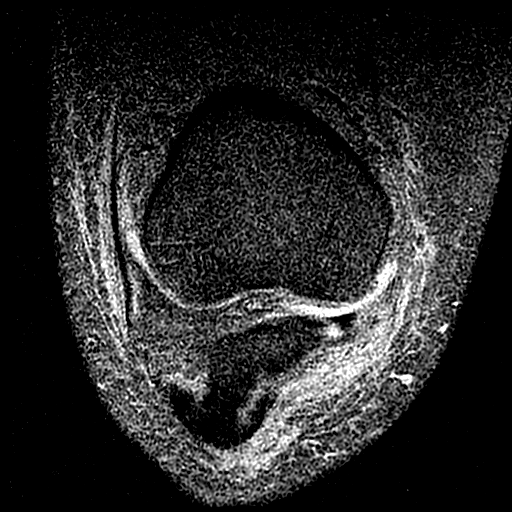

[19 of 40 positions shown; findings below may reference images not displayed]

FINDINGS: MENISCI

Medial meniscus:  Intact.

Lateral meniscus:  Intact.

LIGAMENTS

Cruciates:  Intact.

Collaterals:  Intact.

CARTILAGE

Patellofemoral:  Normal.

Medial:  Mild degenerative chondrosis.

Lateral:  Mild degenerative chondrosis.

Joint:  Moderate to large joint effusion.  No synovitis.

Popliteal Fossa:  No popliteal mass or Baker's cyst.

Extensor Mechanism: The patellar tendon is completely ruptured from
its patellar attachment site with associated patella Alta. Probable
severe underlying tendinopathy. There is inflammation, fluid and
hemorrhage around the tendon. The retinacular structures are also
torn as is the medial patellofemoral ligament from its patellar
attachment.

Bones: Associated avulsion fracture involving the inferior pole of
the patella with a small bony fragment displaced laterally.
IMPRESSION: 1. Complete patellar tendon rupture from its patellar attachment
site with associated patellar avulsion fracture. Probable severe
underlying tendinopathy.
2. The medial and lateral retinacular structures are torn along with
the medial patellofemoral ligament.
3. Intact cruciate and collateral ligaments and no meniscal tears.
4. Mild medial and lateral compartment degenerative
chondrosis/chondromalacia.

## 2016-04-03 ENCOUNTER — Ambulatory Visit (INDEPENDENT_AMBULATORY_CARE_PROVIDER_SITE_OTHER): Payer: Medicaid Other | Admitting: Family Medicine

## 2016-04-03 VITALS — BP 138/92 | HR 84 | Temp 98.1°F | Ht 65.0 in | Wt 206.0 lb

## 2016-04-03 DIAGNOSIS — Z23 Encounter for immunization: Secondary | ICD-10-CM

## 2016-04-03 DIAGNOSIS — I10 Essential (primary) hypertension: Secondary | ICD-10-CM | POA: Diagnosis not present

## 2016-04-03 DIAGNOSIS — Z1159 Encounter for screening for other viral diseases: Secondary | ICD-10-CM

## 2016-04-03 DIAGNOSIS — Z114 Encounter for screening for human immunodeficiency virus [HIV]: Secondary | ICD-10-CM

## 2016-04-03 DIAGNOSIS — L930 Discoid lupus erythematosus: Secondary | ICD-10-CM

## 2016-04-03 DIAGNOSIS — Z0000 Encounter for general adult medical examination without abnormal findings: Secondary | ICD-10-CM | POA: Diagnosis not present

## 2016-04-03 MED ORDER — LISINOPRIL 20 MG PO TABS: 20.0000 mg | ORAL_TABLET | Freq: Every day | ORAL | 3 refills | Status: DC

## 2016-04-03 MED ORDER — TRIAMCINOLONE ACETONIDE 0.5 % EX OINT: 1.0000 "application " | TOPICAL_OINTMENT | Freq: Two times a day (BID) | CUTANEOUS | 2 refills | Status: DC

## 2016-04-03 NOTE — Progress Notes (Signed)
Subjective:   Patient ID: Ray Davis    DOB: 09/29/1975, 41 y.o. male   MRN: [id number]  CC: establish care   HPI: Ray Davis is a 41 y.o. male who presents to clinic today to establish care a as a new patient.  He has lived in the area for some time but did not have a PCP.  Was using [employer] card and saw doctors previously for his lupus.   States he struggles with joint pain and would like a referral to a rheumatologist for his lupus.  He was taking more hydrocodone for the pain before orange card expired sometime 6 mo to 1 year ago but stopped it due to having blood in stool.  Is not taking pain medicine right now.   Lupus Continued joint pain in bilateral shoulders, knees, ankles.  Reports he has been having flare ups so has been going back and forth between hospitals- [facility] and [facility].  His skin lesions appear to be worsening and he has been using 0.1% triamcinolone cream but has not been helping much, may require a higher dose.   HTN Takes lisinopril 20 mg daily, reports that he has not really been taking it.   Does not have any specific reasons.  Reports that he watches what he eats but does not eat a lot of salt in general.  He has not been exercising lately.  Used to work out but ever since the passing of his siblings he feels down and stopped caring.   Depressed mood Has had multiple family members pass away.  No SI, HI or sx of depression.  Feels sad but enough time has passed for him to feel ok now.  He reports he currently feels well and that he would like to start back up with bike riding and walking.   Social: Lives at home with his girlfriend.  Smokes about 2 cigars/day, THC every once in a while, Etoh use 3-4 days out of the week 40 oz of beer, no ivda Fhx: Mom passed away when patient was 13 from liver cirrhosis, brother passed away 4 years ago from same, sister passed away from leukemia.  Grandmother and some aunts with cancer but he is unsure what type.   Surgical hx:  R knee  arthroscopy , patella tendon repair, nose surgery to stop nosebleeds.    ROS: Denies fevers, chills, nausea, vomiting, diarrhea, abdominal pain.    Smoking status reviewed. Medications reviewed.  Objective:   BP (!) 138/92   Pulse 84   Temp 98.1 F (36.7 C)   Ht 5\' 5"  (1.651 m)   Wt 206 lb (93.4 kg)   SpO2 98%   BMI 34.28 kg/m  Vitals and nursing note reviewed.  General: well nourished, well developed, in no acute distress HEENT: normocephalic, atraumatic, moist mucous membranes, multiple pink superficial discoid lesions over face  Neck: supple, non-tender without lymphadenopathy CV: RRR no MRG  Lungs: clear to auscultation bilaterally with normal work of breathing Abdomen: soft, non-tender, no masses or organomegaly palpable, +bs Skin: warm, dry, no rashes or lesions, cap refill < 2 seconds Extremities: warm and well perfused, normal tone  Assessment & Plan:   41 yo male here today to establish care.   HTN BP stable at today's visit.   Patient reports he does not really take it. -Advised to take everyday and encouraged medication compliance Refilled Lisinopril and will continue current regimen.   Lupus Stable.  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  Will increase to 0.5%.  - Referral provided for rheumatology.    Health Maintenance Issues -Flu shot administered -HIV screen -Tdap   Orders Placed This Encounter  Procedures  . Tdap vaccine greater than or equal to 7yo IM  . Flu Vaccine QUAD 36+ mos IM  . HIV antibody  . Ambulatory referral to Rheumatology    Referral Priority:   Routine    Referral Type:   Consultation    Referral Reason:   Specialty Services Required    Requested Specialty:   Rheumatology    Number of Visits Requested:   1   Meds ordered this encounter  Medications  . triamcinolone ointment (KENALOG) 0.5 %    Sig: Apply 1 application topically 2 (two) times daily.     Dispense:  30 g    Refill:  2  . lisinopril (PRINIVIL,ZESTRIL) 20 MG tablet    Sig: Take 1 tablet (20 mg total) by mouth daily.    Dispense:  30 tablet    Refill:  3   Follow up : 3 mo  Freddrick March, MD [facility], PGY-1 04/04/2016 8:23 PM

## 2016-04-03 NOTE — Patient Instructions (Addendum)
It was very nice meeting you today.  You were seen in clinic to establish care with me as your PCP.  We reviewed your medical history and medications.  Your refills were sent to your pharmacy for pick-up.   I also provided you with a referral to be seen by a Rheumatologist for your Lupus.  I will follow up with you with the results of today's tests if they are abnormal.    Freddrick MarchYashika Pernell Lenoir, MD

## 2016-04-04 LAB — HIV ANTIBODY (ROUTINE TESTING W REFLEX): HIV 1&2 Ab, 4th Generation: NONREACTIVE

## 2016-05-15 ENCOUNTER — Other Ambulatory Visit: Payer: Self-pay | Admitting: Family Medicine

## 2016-05-15 ENCOUNTER — Telehealth: Payer: Self-pay | Admitting: Family Medicine

## 2016-05-15 MED ORDER — TRIAMCINOLONE ACETONIDE 0.5 % EX OINT: 1.0000 "application " | TOPICAL_OINTMENT | Freq: Two times a day (BID) | CUTANEOUS | 2 refills | Status: DC

## 2016-05-15 MED ORDER — CLOBETASOL PROPIONATE 0.05 % EX OINT: 1.0000 "application " | TOPICAL_OINTMENT | Freq: Two times a day (BID) | CUTANEOUS | 1 refills | Status: DC

## 2016-05-15 NOTE — Telephone Encounter (Signed)
LMOVM for patient to return call. I need to know where patient would like referral sent. ([facility] or [facility])

## 2016-05-15 NOTE — Telephone Encounter (Signed)
Pt  calling to request refill of:  Name of Medication(s):  Kenalog and another cream pt didn't know the name of, but it is for lupus Last date of OV:  04-03-16 Pharmacy:  [facility] [address]  Will route refill request to Clinic RN.  Discussed with patient policy to call pharmacy for future refills.  Also, discussed refills may take up to 48 hours to approve or deny.  Markus JarvisEmily C Pittman

## 2016-05-15 NOTE — Telephone Encounter (Signed)
Pt wants to know why referral to rheumatology was denied. Pt states he needs a referral for lupus. ep

## 2016-05-15 NOTE — Telephone Encounter (Signed)
Dr. Corliss Skainseveshwar never gives reasoning as to why she declines to see certain patient and I was unaware that it had been denied (no message sent back to me from their office). I will send referral to another office.

## 2016-05-15 NOTE — Telephone Encounter (Signed)
Pt said he would go to [place]

## 2016-05-15 NOTE — Telephone Encounter (Signed)
Thank you, [facility] Dr. Sharmon RevereZiolkowska. Patient will be contacted directly to schedule the appointment.

## 2016-06-26 ENCOUNTER — Other Ambulatory Visit: Payer: Self-pay | Admitting: Family Medicine

## 2016-07-31 ENCOUNTER — Other Ambulatory Visit: Payer: Self-pay | Admitting: Family Medicine

## 2016-07-31 DIAGNOSIS — I10 Essential (primary) hypertension: Secondary | ICD-10-CM

## 2016-08-05 NOTE — Telephone Encounter (Signed)
2nd request.  Martin, Tamika L, RN  

## 2016-08-15 ENCOUNTER — Ambulatory Visit (INDEPENDENT_AMBULATORY_CARE_PROVIDER_SITE_OTHER): Payer: Medicaid Other | Admitting: Family Medicine

## 2016-08-15 DIAGNOSIS — L930 Discoid lupus erythematosus: Secondary | ICD-10-CM

## 2016-08-15 MED ORDER — OXYCODONE-ACETAMINOPHEN 10-325 MG PO TABS: 1.0000 | ORAL_TABLET | Freq: Four times a day (QID) | ORAL | 0 refills | Status: DC | PRN

## 2016-08-15 MED ORDER — PREDNISONE 10 MG PO TABS: 30.0000 mg | ORAL_TABLET | Freq: Every day | ORAL | 0 refills | Status: DC

## 2016-08-15 MED ORDER — CLOBETASOL PROPIONATE 0.05 % EX OINT: 1.0000 "application " | TOPICAL_OINTMENT | Freq: Two times a day (BID) | CUTANEOUS | 3 refills | Status: DC

## 2016-08-15 MED ORDER — TRIAMCINOLONE ACETONIDE 0.5 % EX OINT: 1.0000 "application " | TOPICAL_OINTMENT | Freq: Two times a day (BID) | CUTANEOUS | 3 refills | Status: DC

## 2016-08-15 NOTE — Patient Instructions (Addendum)
I have restarted your Prednisone.  You are to take 30 mg (3 tablets) every other day first thing in the morning.  I have also refilled your Clobetasol and Triamcinolone ointments and these are available for pickup at your pharmacy.  In the meantime please call the Rheumatology office to see if you can be scheduled earlier than Sept.   I will see you back in 1 month.   Please call clinic with any questions.   Be well,  Freddrick MarchYashika Elijan Googe, MD

## 2016-08-15 NOTE — Progress Notes (Signed)
Subjective:   Patient ID: Ray Davis    DOB: [birth year], 41 y.o. male   MRN: [id number]  CC: joint pain, body aches   HPI: Ray Davis is a 41 y.o. male who presents to clinic today for joint paint and generalized body aches.   He has a long history of lupus, diagnosed >10 years ago.  Patient notes he has not seen a Rheumatologist in about 6 years ago because he did not have insurance.  He was given a referral at last visit for Rheumatology.  Takes Prednisone 20 mg, uses Clobetasol ointment BID, and Triamcinolone cream BID.   Has not taken his steroids in >6 months because he was waiting on Medicaid to kick in since his [place] card expired.  Has chronically been on Prednisone for 10 years.  He states he did not follow up with Rheumatology yet and was able to get an appointment in [facility] for September.  Feels like that is too far away for him and is in chronic pain from his flare-ups.  Denies fevers, chills, nausea, vomiting, diarrhea.  Denies abdominal pain, changes in voiding or bowel habits.  Endorses generalized achy joint pain.     ROS: See HPI for pertinent ROS.  Smoking status reviewed. Medications reviewed.  Objective:   BP (!) 154/100   Pulse 77   Temp 98.2 F (36.8 C) (Oral)   Ht 5\' 5"  (1.651 m)   Wt 205 lb 6.1 oz (93.2 kg)   SpO2 98%   BMI 34.18 kg/m  Vitals and nursing note reviewed.   General: 41 yo male, in no acute distress  HEENT: NCAT, MMM, o/p clear  Neck: supple  CV: RRR no MRG  Lungs: CTAB, comfortable work of breathing  Abdomen: soft, NTND, +bs  Skin: multiple areas of scaly, disk-like plaques on scalp, face and upper back with hypopigmentation, no open lesions  Extremities: warm, well perfused  Psych: normal mood and affect   Assessment & Plan:   LUPUS ERYTHEMATOSUS, DISCOID Patient not taking Prednisone or using ointments for last several months due to insurance issue.  -Restart Prednisone 30 mg every other day -Refill Clobetasol and  Triamcinolone ointment -Will prescribe small amount of Percocet for pain to last him over next several weeks as he restarts his steroids  -Patient has Rheumatology appointment in September.  Will try to reschedule for earlier if possible.  -Follow up : 1 month   No orders of the defined types were placed in this encounter.  Meds ordered this encounter  Medications  . triamcinolone ointment (KENALOG) 0.5 %    Sig: Apply 1 application topically 2 (two) times daily.    Dispense:  60 g    Refill:  3  . clobetasol ointment (TEMOVATE) 0.05 %    Sig: Apply 1 application topically 2 (two) times daily.    Dispense:  60 g    Refill:  3  . DISCONTD: oxyCODONE-acetaminophen (PERCOCET) 10-325 MG tablet    Sig: Take 1-2 tablets by mouth every 6 (six) hours as needed for pain.    Dispense:  50 tablet    Refill:  0  . DISCONTD: predniSONE (DELTASONE) 10 MG tablet    Sig: Take 3 tablets (30 mg total) by mouth daily with breakfast.    Dispense:  130 tablet    Refill:  0  . predniSONE (DELTASONE) 10 MG tablet    Sig: Take 3 tablets (30 mg total) by mouth daily with breakfast. Take 30 mg (3 tabs)  every other day first thing in the morning.    Dispense:  130 tablet    Refill:  0  . oxyCODONE-acetaminophen (PERCOCET) 10-325 MG tablet    Sig: Take 1-2 tablets by mouth every 6 (six) hours as needed for pain.    Dispense:  50 tablet    Refill:  0   Follow up: 1 month   Freddrick MarchYashika Ector Laurel, MD [facility], PGY-1 08/24/2016 1:38 AM

## 2016-08-19 ENCOUNTER — Telehealth: Payer: Self-pay | Admitting: Family Medicine

## 2016-08-19 ENCOUNTER — Telehealth: Payer: Self-pay | Admitting: *Deleted

## 2016-08-19 NOTE — Telephone Encounter (Signed)
Pt is calling and would like the nurse to call his pharmacy. He has been trying to get his medications since last Thursday when they were written. The pharmacy is telling him that some things are missing from the prescription. He was told to call his doctor's office and have us to call them. jw

## 2016-08-19 NOTE — Telephone Encounter (Signed)
Pharmacist from [facility] called stating the Rx for Percocet was cancelled because it was told to the pharmacy that prior authorization would not be completed.  Patient is asking for a new medication.  Any controlled pain medication will require a prior authorization form Medicaid.  Ray Davis, Ray L, RN

## 2016-08-19 NOTE — Telephone Encounter (Signed)
Will forward to MD (see previous phone note)

## 2016-08-20 NOTE — Telephone Encounter (Signed)
Patient informed that PA for oxycodone-acetaminophen 10-325 mg #50 was approved via [employer] until 02/16/17. Pt advised to pick up Rx for pain med on Thursday morning. [facility]  Approval number: [id number].  Patient wanted PCP to give him a call to discuss the quantity of the pain medication. Patient does not think the it will last a month.  Pt reported having patient with pain med and prednisone.    If quantity of medication is changed, please let RN know. A new PA will need to be completed.    Clovis PuMartin, Abhay Godbolt L, RN

## 2016-08-21 MED ORDER — OXYCODONE-ACETAMINOPHEN 10-325 MG PO TABS: 1.0000 | ORAL_TABLET | Freq: Four times a day (QID) | ORAL | 0 refills | Status: DC | PRN

## 2016-08-21 NOTE — Telephone Encounter (Signed)
No change will be made to the quantity, I had discussed that with him at our visit.  Will leave the Rx at the front desk today and he can pick it up tomorrow .  Thanks for informing him!

## 2016-08-21 NOTE — Telephone Encounter (Signed)
Spoke with [facility] pharmacy. I am leaving the Rx at front desk in clinic for him to pick up on Thursday morning.

## 2016-08-23 NOTE — Assessment & Plan Note (Addendum)
Patient not taking Prednisone or using ointments for last several months due to insurance issue.  -Restart Prednisone 30 mg every other day -Refill Clobetasol and Triamcinolone ointment -Will prescribe small amount of Percocet for pain to last him over next several weeks as he restarts his steroids  -Patient has Rheumatology appointment in September.  Will try to reschedule for earlier if possible.  -Follow up : 1 month

## 2016-08-23 NOTE — Telephone Encounter (Signed)
Rx has been picked up

## 2016-08-31 IMAGING — XA IR ANGIO/EXISTING CATHETER
1 series · 10 of 24 positions shown · IV contrast (IODINE)
Comparison: none

CLINICAL DATA: Recurrent epistaxis.

[Series 300: ir angio intra extracran sel com carotid · 10 of 381 slices shown]
[im 17/381]
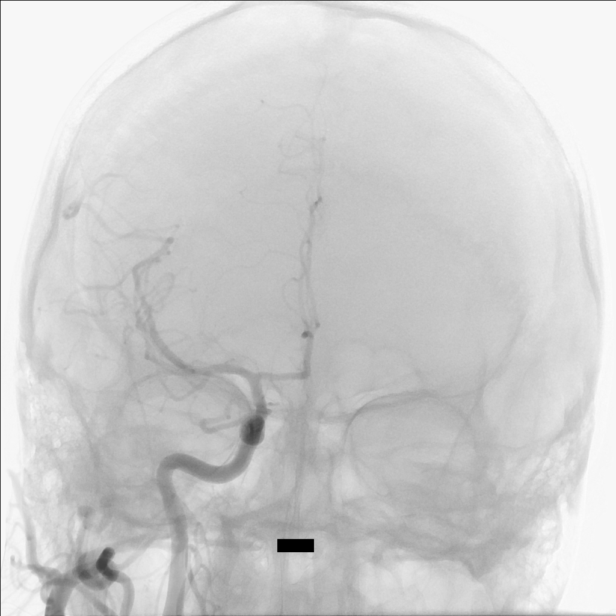
[im 50/381]
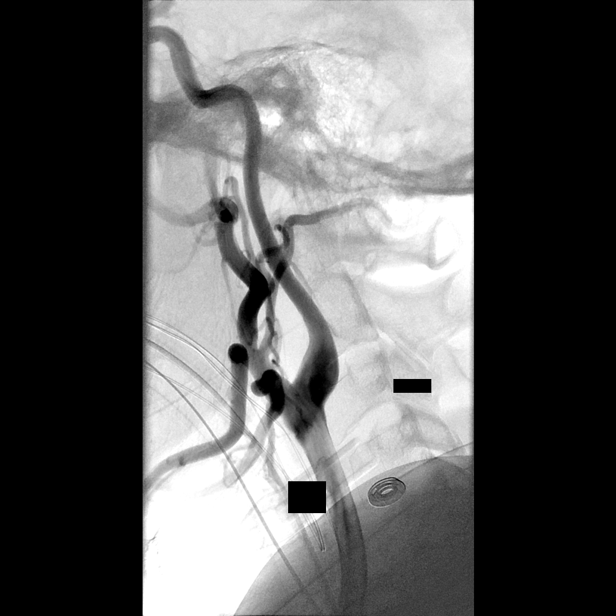
[im 100/381]
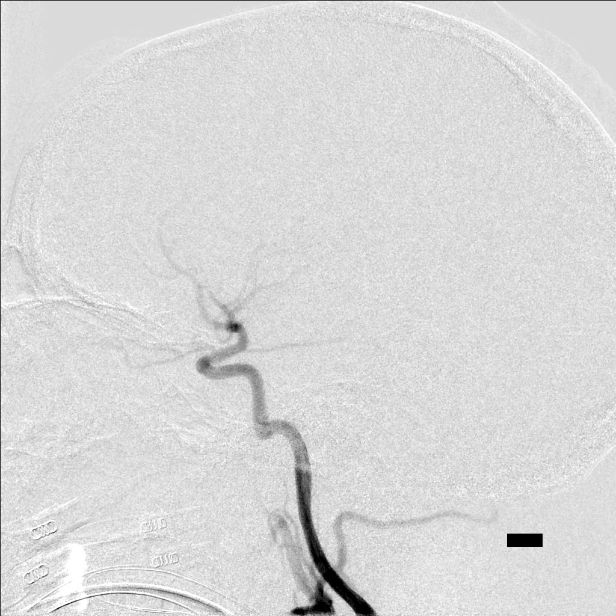
[im 133/381]
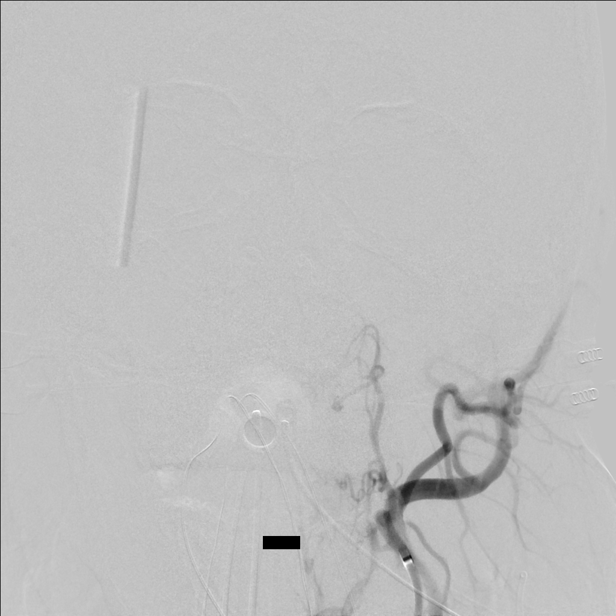
[im 166/381]
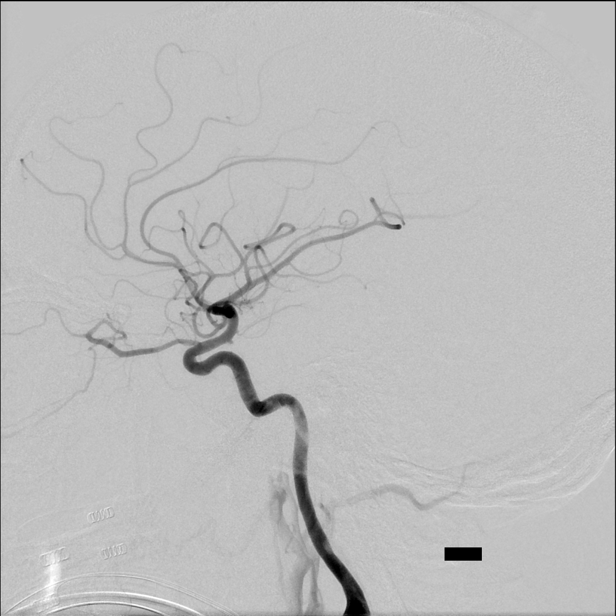
[im 215/381]
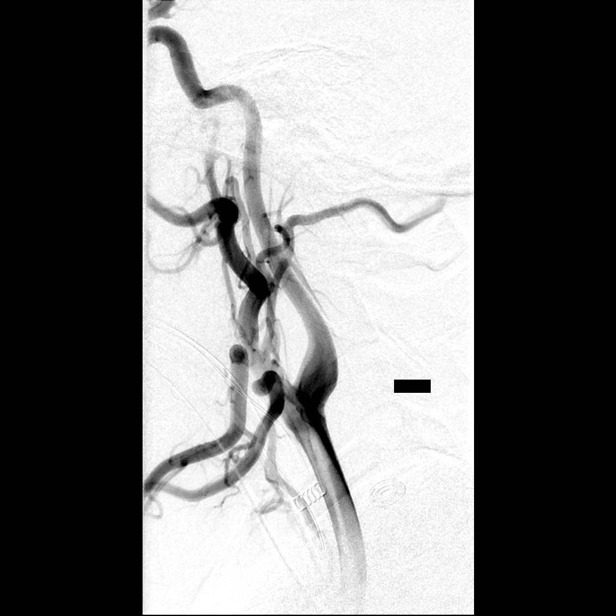
[im 248/381]
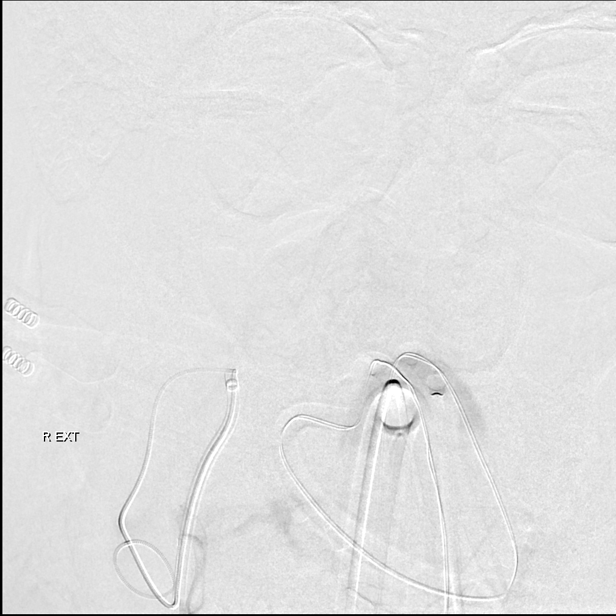
[im 298/381]
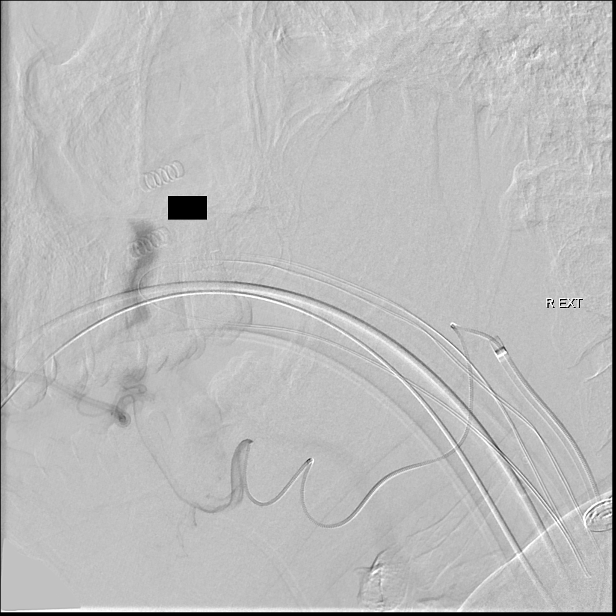
[im 331/381]
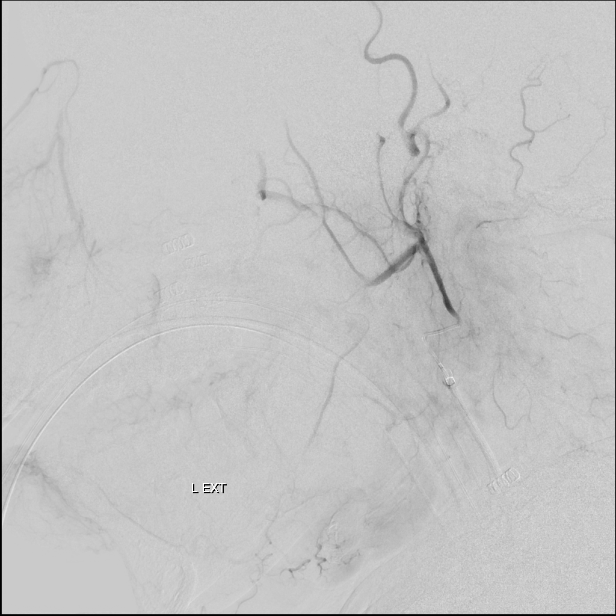
[im 364/381]
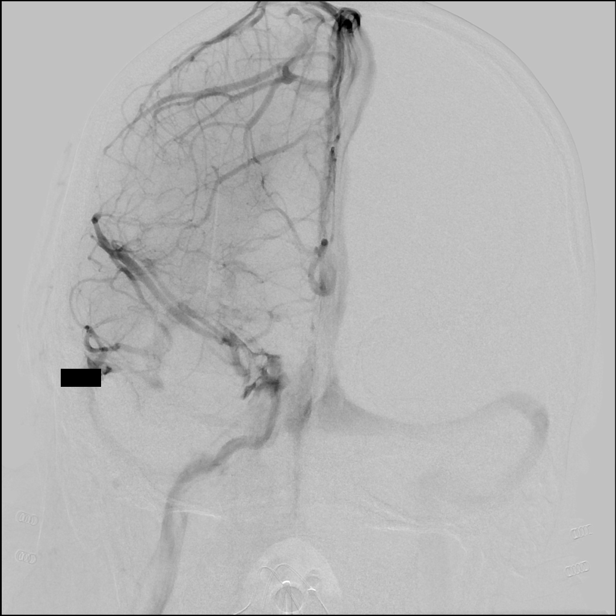

[10 of 24 positions shown; findings below may reference images not displayed]

EXAM:
BILATERAL COMMON CAROTID AND INNOMINATE ANGIOGRAPHY AND BILATERAL
EXTERNAL CAROTID ARTERIOGRAMS FOLLOWED BY ENDOVASCULAR
SUPERSELECTIVE EMBOLIZATION OF INFRAORBITAL ARTERIES, INTERNAL
MAXILLARY ARTERIES, AND SEPTAL BRANCHES OF THE FACIAL ARTERIES WITH
PVA PARTICLES:

PROCEDURE:
Contrast: 125 mL OMNIPAQUE IOHEXOL 300 MG/ML  SOLN

Anesthesia/Sedation:   General anesthesia.

Medications:  As per general anesthesia.

Following a full explanation of the procedure along with the
potential associated complications, an informed witnessed consent
was obtained.

The right groin was prepped and draped in the usual sterile fashion.
Thereafter using modified Seldinger technique, transfemoral access
into the right common femoral artery was obtained without
difficulty. Over a 0.035 inch guidewire, a 5 French Pinnacle sheath
was inserted. Through this, and also over 0.035 inch guidewire, a 5
French JB1 catheter was advanced to the aortic arch region and
selectively positioned in the right common carotid artery, the left
common carotid artery, left external carotid artery and right
external carotid artery.

There were no acute complications. The patient tolerated the
procedure well.
FINDINGS: The right common carotid arteriogram demonstrates the right external
carotid artery major branches to be widely patent proximally.

The right internal carotid artery at the bulb to the cranial skull
base is seen to opacify normally.

The petrous, the cavernous and the supraclinoid segments are widely
patent.

A prominent right ophthalmic artery is seen.

The right middle and the right anterior cerebral arteries opacify
into the capillary and the venous phases.

A selective right external carotid arteriogram demonstrates
abnormally prominent internal maxillary artery distally, the
infraorbital artery and also the facial artery branches to the
septal area.

The ascending pharyngeal artery is also noted to be patent with mild
prominence.

The right occipital artery appears widely patent as well.

The left common carotid arteriogram demonstrates the left external
carotid artery and its major branches to be patent proximally.

The left internal carotid artery at the bulb to the cranial skull
base opacifies normally. The petrous, the cavernous and the
supraclinoid segments are widely patent.

There is mild fusiform dilatation of the internal carotid artery in
the parapelvic region.

A left posterior communicating artery is seen opacifying the left
posterior cerebral artery transiently.

The left middle and the left anterior cerebral arteries opacify into
the capillary and the venous phases.

A selective left external carotid arteriogram demonstrates abnormal
prominence of the distal internal maxillary artery, the intraorbital
artery branches, and also of the septal branches from the left
facial artery.

The ascending pharyngeal artery, the occipital artery are normally
opacified.

ENDOVASCULAR EMBOLIZATION OF BILATERAL INTERNAL MAXILLARY ARTERIES,
INFRAORBITAL ARTERIES AND THE FACIAL ARTERIES DISTALLY USING PVA
PARTICLES OF SIZES 250-350, 350-500 MICRONS, AND 500-700 MICRONS.

The diagnostic JB1 catheter in the right common carotid artery was
exchanged over a 0.035 inch 300 cm Rosen exchange guidewire for a 6
French 65 cm neurovascular sheath using biplane roadmap technique
and constant fluoroscopic guidance. Good aspiration was obtained
from the side port of the neurovascular sheath. A gentle contrast
injection demonstrated no evidence of spasms, dissections or of
intraluminal filling defects.

This was then connected to continuous heparinized saline infusion.

Over a 0.035 inch Roadrunner guidewire, a 6 French MDP 95 cm
BriteTip Envoy guide catheter was then advanced and positioned just
proximal to the right common carotid bifurcation. Using biplane
roadmap technique and constant fluoroscopic guidance, over a
inch roadrunner guidewire, the 6 French guide catheter was advanced
and positioned just proximal to the origin of the facial artery.

The guidewire was removed. Good aspiration was obtained from the hub
of the 6 French guide catheter. A gentle contrast injection
demonstrated no evidence of spasms, dissections, or of intraluminal
filling defects.

At this time in a coaxial manner and with constant heparinized
saline infusion using biplane roadmap technique and constant
fluoroscopic guidance, a rapid transit 2 tip microcatheter which had
been steam-shaped was then advanced over a 0.014 inch regular
Synchro micro guidewire to the distal end of the guide catheter.

With the micro guidewire leading with a J-Tip configuration, access
was obtained without difficulty into the infraorbital artery on the
right, the internal maxillary artery and then the proximal septal
branches of the right facial artery over a micro guidewire. The
microcatheter was advanced over a micro guidewire with the J-tip
configuration to avoid dissections or inducing spasm, the first
vessel embolized was the right infraorbital artery, followed by the
internal maxillary artery with its inferior and medial branches
projecting into the maxillary sinuses and the ethmoid air cells and
then the septal branches of the facial artery.

Prior to start of the embolizations, the position of the
microcatheter was ascertained to be safe without evidence of
abnormal dangerous communications with the ophthalmic artery, the
internal carotid artery and the vertebral arteries.

Embolization was then performed with PVA particles mixed in 75%
contrast and 25% heparinized saline infusion. Embolization was
performed under constant intermittent fluoroscopy until there was
stasis and/or reflux of the tip of the microcatheter.

Control arteriograms were performed via the microcatheter and the 6
French guide catheter in the proximal right external carotid artery
to be ensure no evidence of new collaterals forming.

The microcatheter was then gently retrieved as was the 6 French
guide catheter into the right common carotid artery. A control
arteriogram performed in the right common carotid artery but
centered intracranially demonstrated no change in the intracranial
circulation. No evidence of filling defects, or occlusions or slow
hemodynamic flow was noted.

Attention was then given to the left external carotid artery
branches. The 6 French guide catheter was again advanced into the
proximal left external carotid artery as mentioned above. After
having confirmed no evidence of spasms, dissections or of
intraluminal filling defects, using biplane roadmap technique and
constant fluoroscopic guidance, again a new rapid transit 2 tip
microcatheter was advanced for a 0.014 inch soft tip Synchro micro
guidewire to the infraorbital artery, the left internal maxillary
artery, and the distal left facial artery branches supplying the
septal area.

Again after having confirmed safe positioning of the tip of the
microcatheter and having dangerous anastomosis with the vertebral
arteries, the internal carotid artery ipsilaterally and the
ophthalmic artery, embolization was carried out using PVA particles
of sizes 250-350, 350-500, and 500-700 microns mixed in 75% contrast
and 25% heparinized saline fusion. Again embolizations were
performed as mentioned earlier until there was stasis at the tip of
the microcatheter and all 3 branches embolized. The embolization was
performed under biplane intermittent fluoroscopy to ensure safe
delivery of the embolic material into the respective vessels.

The microcatheter was gently retrieved and removed. A control
arteriogram performed through the 6 French guide catheter and left
internal carotid artery demonstrated stasis and complete
obliteration of flow in the embolized vessels.

The guide catheter was then advanced proximally into the left common
carotid artery. A control arteriogram performed through the left
common carotid artery but centered intracranially demonstrated no
filling defects, dissections, or of vessel occlusions. The venous
outflow remain normal.

The 6 French guide catheter and the 6 French neurovascular sheath
were then retrieved into the abdominal aorta and exchanged over a
J-Tip guidewire for a 6 French Pinnacle sheath. This was then
replaced via an external closure device successfully.

The patient's ACT during the procedure was maintained in the region
of approximately 160 seconds.

Patient's general anesthesia was then reversed and the patient was
extubated. Upon awakening, the patient demonstrated no new
neurological signs symptoms. No active bleeding was noted in the
pharyngeal region, and also in the nasal packs.
IMPRESSION: Status post endovascular superselective embolization of bilateral
infraorbital arteries, bilateral internal maxillary arteries, and
bilateral septal branches of the facial arteries with PVA particles
of sizes 250-350 microns, 350-500 microns, and 500-700 microns as
described above.

## 2016-09-16 ENCOUNTER — Ambulatory Visit (INDEPENDENT_AMBULATORY_CARE_PROVIDER_SITE_OTHER): Payer: Medicaid Other | Admitting: Family Medicine

## 2016-09-16 DIAGNOSIS — I10 Essential (primary) hypertension: Secondary | ICD-10-CM

## 2016-09-16 DIAGNOSIS — L930 Discoid lupus erythematosus: Secondary | ICD-10-CM | POA: Diagnosis present

## 2016-09-16 MED ORDER — PREDNISONE 10 MG PO TABS: 30.0000 mg | ORAL_TABLET | Freq: Every day | ORAL | 0 refills | Status: AC

## 2016-09-16 MED ORDER — OXYCODONE-ACETAMINOPHEN 10-325 MG PO TABS: 1.0000 | ORAL_TABLET | Freq: Four times a day (QID) | ORAL | 0 refills | Status: DC | PRN

## 2016-09-16 MED ORDER — METHOCARBAMOL 500 MG PO TABS: 500.0000 mg | ORAL_TABLET | Freq: Four times a day (QID) | ORAL | 0 refills | Status: DC

## 2016-09-16 NOTE — Patient Instructions (Addendum)
It was nice seeing you again today!  You were seen in clinic today for follow up for lupus and high blood pressure.    I have sent in a refill for your Prednisone and would like you to continue taking 30 mg every other day as you have been doing.  Additionally, I have refilled Percocet to control some of your pain however like I discussed, we will try to look into other less harmful medications for you to take in the long term.  I have restarted your Robaxin (muscle relaxant) which should help with some of these symptoms.  Also, make sure you keep [facility] with Rheumatology for further management of your lupus.    For your high blood pressure,  I would like you to take 1 and a half tablets (30 mg ) everyday instead of your 20 mg.   I would recommend taking your medication everyday as prescribed and continuing to have a low-salt diet as well as exercise.  As we discussed, both of these things can help lower your blood pressure.  You can follow up in clinic with me in 2-3 weeks to recheck your blood pressure.  At that time, we will make additional changes if necessary.    Call clinic with any questions.    Be well,  Freddrick MarchYashika Garet Hooton, MD

## 2016-09-16 NOTE — Progress Notes (Signed)
Subjective:   Patient ID: Ray Davis    DOB: 09/04/1975, 41 y.o. male   MRN: [id number]  CC: follow up blood pressures and lupus   HPI: Ray Davis is a 41 y.o. male who presents to clinic today for f/u BP and lupus.    Hypertension  Pt states he used to measure his blood pressures at home but as of this week his BP monitor has broken and he has not been checking.  He has noted pressures as high as systolics in the 180s on almost a daily basis prior to last week.  Denies headache or vision changes but reports not feeling like himself when his blood pressures are so high. He takes Lisinopril and pressures come down.  He reports he has recently changed his diet.  Stopped eating beef and fried foods and wants to eventually stop eating meat as he feels it is unhealthy for him.   Does not use much salt in his cooking.  Does not eat a lot of processed/packaged foods and snacks.  Does not eat canned foods. He cooks at home with fresh groceries.  Currently taking Lisinopril 20 mg daily.  Reports he takes it most of the time but not everyday.  Starting from tomorrow he wants to start going to the gym again.      Lupus At last visit, I restarted his Prednisone with alternate day dosing @ 30 mg every other day.  Also refilled Clobetasol and Triamcinolone.  Pt was previously taking Prednisone 20 mg daily x10 years but had not taken this for >6 months due to insurance issues. Pt reports being back on Prednisone has helped somewhat.  He reports good compliance with steroid regimen.  He was prescribed Percocet for pain to last him over several weeks until he restarts his steroids.  He stated these helped and while he would not like to be on narcotics long-term.  He notes his muscles feel tight and crampy sometimes.  Used to take Robaxin in the past which helped somewhat.   We discussed switching from Percocet to a non-opioid medication in the near future and he was agreeable to this.     ROS: See HPI for  pertinent ROS. Smoking status reviewed. Smokes 2 cigars/day.   Medications reviewed. Objective:   BP (!) 150/100   Pulse 77   Temp 98.1 F (36.7 C) (Oral)   Ht 5\' 5"  (1.651 m)   Wt 208 lb (94.3 kg)   SpO2 98%   BMI 34.61 kg/m  Vitals and nursing note reviewed.   General: 41 yo male, appears comfortable   HEENT: normal, EOMI   Neck: supple  CV: RRR no MRG  Lungs: CTAB, comfortable work of breathing  Abdomen: soft, NTND, +bs  Skin: improving areas of scaly, disk-like plaques on scalp, face and upper back with hypopigmentation, no open lesions  Extremities: moves all extremities spontaneously Neuro: alert, normal tone  Psych: normal mood and affect    Assessment & Plan:   LUPUS ERYTHEMATOSUS, DISCOID Lesions improving.  Pt has restarted steroids and taking Prednisone 30 mg every other day.   -Prednisone refilled  -Keep Rheumatology appointment in September    Hypertension Pt reports high blood pressures at home - as high as 180's systolic.  At home on 20 mg Lisinopril daily.  Reports decent compliance but does not always take med everyday.  Eats a low-sodium diet at home.  -Encouraged medication compliance -Continue low-sodium diet -Recommend checking BPs at home  -Increase Lisinopril  to 30 mg daily for now (have instructed pt to take 1.5 tabs per day) -I expect with good compliance and increased dose Lisinopril, BP will be better controlled.  However, RTC in 2-3 weeks for BP check  Meds ordered this encounter  Medications  . predniSONE (DELTASONE) 10 MG tablet    Sig: Take 3 tablets (30 mg total) by mouth daily with breakfast. Take 30 mg (3 tabs) every other day first thing in the morning.    Dispense:  130 tablet    Refill:  0  . methocarbamol (ROBAXIN) 500 MG tablet    Sig: Take 1 tablet (500 mg total) by mouth 4 (four) times daily.    Dispense:  30 tablet    Refill:  0  . oxyCODONE-acetaminophen (PERCOCET) 10-325 MG tablet    Sig: Take 1-2 tablets by mouth every  6 (six) hours as needed for pain.    Dispense:  50 tablet    Refill:  0   Follow up: 2-3 weeks   Freddrick MarchYashika Patience Nuzzo, MD [facility], PGY-1 09/17/2016 2:32 PM

## 2016-09-17 ENCOUNTER — Other Ambulatory Visit: Payer: Self-pay | Admitting: *Deleted

## 2016-09-17 MED ORDER — TRIAMCINOLONE ACETONIDE 0.5 % EX OINT: 1.0000 "application " | TOPICAL_OINTMENT | Freq: Two times a day (BID) | CUTANEOUS | 3 refills | Status: DC

## 2016-09-17 NOTE — Assessment & Plan Note (Addendum)
Pt reports high blood pressures at home - as high as 180's systolic.  At home on 20 mg Lisinopril daily.  Reports decent compliance but does not always take med everyday.  Eats a low-sodium diet at home.  -Encouraged medication compliance -Continue low-sodium diet -Recommend checking BPs at home  -Increase Lisinopril to 30 mg daily for now (have instructed pt to take 1.5 tabs per day) -I expect with good compliance and increased dose Lisinopril, BP will be better controlled.  However, RTC in 2-3 weeks for BP check

## 2016-09-17 NOTE — Assessment & Plan Note (Addendum)
Lesions improving.  Pt has restarted steroids and taking Prednisone 30 mg every other day.   -Prednisone refilled  -Keep Rheumatology appointment in September

## 2016-10-11 ENCOUNTER — Ambulatory Visit (INDEPENDENT_AMBULATORY_CARE_PROVIDER_SITE_OTHER): Payer: Medicaid Other | Admitting: Family Medicine

## 2016-10-11 DIAGNOSIS — I10 Essential (primary) hypertension: Secondary | ICD-10-CM | POA: Diagnosis not present

## 2016-10-11 DIAGNOSIS — L930 Discoid lupus erythematosus: Secondary | ICD-10-CM | POA: Diagnosis present

## 2016-10-11 NOTE — Patient Instructions (Signed)
Rheumatology office:  [phone] Dr. Sharmon Revere (Dr. Herma Carson)  Please call this office for your appointment date and time.

## 2016-10-11 NOTE — Progress Notes (Signed)
   Subjective:   Patient ID: Ray Davis    DOB: [birth year], 41 y.o. male   MRN: [id number]  CC: follow up hypertension   HPI: Ray Davis is a 41 y.o. male who presents to clinic today for htn f/u.   Problems discussed today are as follows:   Discoid Lupus -continues to take 30 mg Prednisone every other day as prescribed  -Pt to see Rheumatology at [facility] this September;  appointment is with Dr. Sharmon Revere   HTN -Currently taking lisinopril 30 mg daily -Reports good compliance  -Denies headaches, blurry vision, lightheadedness, dizziness   ROS: Denies fevers, chills, blurry vision, lightheadedness, dizziness.  Denies CP, palpitations, shortness of breath.  PMFSH: Pertinent past medical, surgical, family, and social history were reviewed and updated as appropriate. Smoking status reviewed. Medications reviewed.  Objective:   BP (!) 132/100   Pulse 72   Temp 98.1 F (36.7 C) (Oral)   Wt 206 lb (93.4 kg)   BMI 34.28 kg/m  Vitals and nursing note reviewed.  General: 41 yo male, NAD, multiple areas of hypopigmented skin on face and scalp  Neck: supple, no JVD  CV: RRR no MRG, 2+ pedal pulses  Lungs: CTAB, comfortable work of breathing  Skin: warm, dry   Assessment & Plan:   LUPUS ERYTHEMATOSUS, DISCOID Continue current treatment.  Appointment with Dr. Sharmon Revere however patient does not recall date and unavailable in system to check.  Provided pt with office phone number; he is to call [facility] and set this up.  -will continue to follow   Hypertension BP in office 132/100.  Patient states he has been more compliant with taking his Lisinopril.  -encouraged medication compliance -continue low-sodium diet -No changes to medication made at this visit  Follow up: 2 months   Freddrick March, MD [facility], PGY-1 10/21/2016 4:16 PM

## 2016-10-21 NOTE — Assessment & Plan Note (Signed)
Continue current treatment.  Appointment with Dr. Sharmon RevereZiolkowska however patient does not recall date and unavailable in system to check.  Provided pt with office phone number; he is to call [facility] and set this up.  -will continue to follow

## 2016-10-21 NOTE — Assessment & Plan Note (Addendum)
BP in office 132/100.  Patient states he has been more compliant with taking his Lisinopril.  -encouraged medication compliance -continue low-sodium diet -No changes to medication made at this visit

## 2016-10-23 ENCOUNTER — Encounter (HOSPITAL_COMMUNITY): Payer: Self-pay | Admitting: *Deleted

## 2016-10-23 ENCOUNTER — Ambulatory Visit (HOSPITAL_COMMUNITY)
Admission: EM | Admit: 2016-10-23 | Discharge: 2016-10-23 | Disposition: A | Discharge status: Home / Self Care | Payer: Medicaid Other | Attending: Family Medicine | Admitting: Family Medicine

## 2016-10-23 DIAGNOSIS — G8929 Other chronic pain: Secondary | ICD-10-CM | POA: Insufficient documentation

## 2016-10-23 DIAGNOSIS — H6121 Impacted cerumen, right ear: Secondary | ICD-10-CM

## 2016-10-23 DIAGNOSIS — F17290 Nicotine dependence, other tobacco product, uncomplicated: Secondary | ICD-10-CM | POA: Diagnosis not present

## 2016-10-23 DIAGNOSIS — R369 Urethral discharge, unspecified: Secondary | ICD-10-CM

## 2016-10-23 DIAGNOSIS — Z7982 Long term (current) use of aspirin: Secondary | ICD-10-CM | POA: Insufficient documentation

## 2016-10-23 DIAGNOSIS — H9203 Otalgia, bilateral: Secondary | ICD-10-CM | POA: Diagnosis not present

## 2016-10-23 DIAGNOSIS — M329 Systemic lupus erythematosus, unspecified: Secondary | ICD-10-CM | POA: Insufficient documentation

## 2016-10-23 DIAGNOSIS — R51 Headache: Secondary | ICD-10-CM | POA: Diagnosis not present

## 2016-10-23 DIAGNOSIS — I10 Essential (primary) hypertension: Secondary | ICD-10-CM | POA: Diagnosis not present

## 2016-10-23 DIAGNOSIS — Z202 Contact with and (suspected) exposure to infections with a predominantly sexual mode of transmission: Secondary | ICD-10-CM

## 2016-10-23 DIAGNOSIS — R360 Urethral discharge without blood: Secondary | ICD-10-CM

## 2016-10-23 DIAGNOSIS — R519 Headache, unspecified: Secondary | ICD-10-CM

## 2016-10-23 LAB — POCT URINALYSIS DIP (DEVICE)
BILIRUBIN URINE: NEGATIVE
GLUCOSE, UA: NEGATIVE mg/dL
Ketones, ur: NEGATIVE mg/dL
NITRITE: NEGATIVE
Protein, ur: 100 mg/dL — AB
Specific Gravity, Urine: >=1.03 (ref 1.005–1.030)
UROBILINOGEN UA: 0.2 mg/dL (ref 0.0–1.0)
pH: 6 (ref 5.0–8.0)

## 2016-10-23 MED ORDER — STERILE WATER FOR INJECTION IJ SOLN
INTRAMUSCULAR | Status: AC
  Filled 2016-10-23: qty 10

## 2016-10-23 MED ORDER — AZITHROMYCIN 250 MG PO TABS
ORAL_TABLET | ORAL | Status: AC
  Filled 2016-10-23: qty 4

## 2016-10-23 MED ORDER — CEFTRIAXONE SODIUM 250 MG IJ SOLR
INTRAMUSCULAR | Status: AC
  Filled 2016-10-23: qty 250

## 2016-10-23 MED ORDER — CEFTRIAXONE SODIUM 250 MG IJ SOLR
250.0000 mg | Freq: Once | INTRAMUSCULAR | Status: AC
  Administered 2016-10-23: 250 mg via INTRAMUSCULAR

## 2016-10-23 MED ORDER — AZITHROMYCIN 250 MG PO TABS
1000.0000 mg | ORAL_TABLET | Freq: Once | ORAL | Status: AC
  Administered 2016-10-23: 1000 mg via ORAL
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
## 2016-10-23 NOTE — Discharge Instructions (Signed)
It was nice meeting you today Ray Davis.   We will call you with the results of your STD testing within 3-5 business days.   It is not necessary to try to clean your ears. This pushes the ear wax further into your ear, and causes buildup of the wax requiring irrigation like we performed today. Your ears clean themselves naturally.   Please be sure to go to your rheumatology appointment on September 20.   Please schedule a follow-up appointment with Dr. Nelson ChimesAmin within the next couple weeks to make sure your discharge and urinary symptoms have improved.   Be well,  Dr. Natale MilchLancaster

## 2016-10-23 NOTE — ED Triage Notes (Signed)
Patient reports pain from lupus.  Reports bilateral ear pain.  Would like tested for STDs.

## 2016-10-24 LAB — URINE CYTOLOGY ANCILLARY ONLY
CHLAMYDIA, DNA PROBE: NEGATIVE
NEISSERIA GONORRHEA: NEGATIVE

## 2016-10-24 LAB — HIV ANTIBODY (ROUTINE TESTING W REFLEX): HIV Screen 4th Generation wRfx: NONREACTIVE

## 2016-10-30 ENCOUNTER — Encounter (INDEPENDENT_AMBULATORY_CARE_PROVIDER_SITE_OTHER): Payer: Self-pay | Admitting: Orthopedic Surgery

## 2016-10-30 ENCOUNTER — Ambulatory Visit (INDEPENDENT_AMBULATORY_CARE_PROVIDER_SITE_OTHER): Payer: Medicaid Other | Admitting: Orthopedic Surgery

## 2016-10-30 DIAGNOSIS — G8929 Other chronic pain: Secondary | ICD-10-CM

## 2016-10-30 DIAGNOSIS — M25562 Pain in left knee: Secondary | ICD-10-CM | POA: Diagnosis not present

## 2016-10-30 DIAGNOSIS — M25561 Pain in right knee: Secondary | ICD-10-CM | POA: Diagnosis not present
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
## 2016-11-11 ENCOUNTER — Ambulatory Visit: Payer: Medicaid Other | Attending: Orthopedic Surgery

## 2016-11-13 ENCOUNTER — Ambulatory Visit: Payer: Medicaid Other

## 2016-11-22 ENCOUNTER — Ambulatory Visit (HOSPITAL_COMMUNITY)
Admission: EM | Admit: 2016-11-22 | Discharge: 2016-11-22 | Disposition: A | Discharge status: Home / Self Care | Payer: Medicaid Other | Attending: Emergency Medicine | Admitting: Emergency Medicine

## 2016-11-22 DIAGNOSIS — G8929 Other chronic pain: Secondary | ICD-10-CM | POA: Diagnosis not present

## 2016-11-22 DIAGNOSIS — F17290 Nicotine dependence, other tobacco product, uncomplicated: Secondary | ICD-10-CM | POA: Diagnosis not present

## 2016-11-22 DIAGNOSIS — I10 Essential (primary) hypertension: Secondary | ICD-10-CM | POA: Insufficient documentation

## 2016-11-22 DIAGNOSIS — L930 Discoid lupus erythematosus: Secondary | ICD-10-CM | POA: Diagnosis not present

## 2016-11-22 DIAGNOSIS — R369 Urethral discharge, unspecified: Secondary | ICD-10-CM | POA: Diagnosis not present

## 2016-11-22 DIAGNOSIS — R52 Pain, unspecified: Secondary | ICD-10-CM | POA: Diagnosis present

## 2016-11-22 DIAGNOSIS — E669 Obesity, unspecified: Secondary | ICD-10-CM | POA: Insufficient documentation

## 2016-11-22 MED ORDER — METRONIDAZOLE 500 MG PO TABS: 500.0000 mg | ORAL_TABLET | Freq: Two times a day (BID) | ORAL | 0 refills | Status: AC
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
## 2016-11-22 NOTE — ED Triage Notes (Signed)
Patient reports pain from lupus.... Seen here on 9/5 for similar   Would like tested for STDs.... Reports partner is being treated for trich  A&O x4... NAD... Ambulatory

## 2016-11-22 NOTE — Discharge Instructions (Signed)
You have been prescribed Flagyl to treat other possible organisms causing your discharge. If after taking this medication you continue to have the discharge call your urologist for an appointment.continue the prednisone and follow-up with her primary care doctor or rheumatologist for chronic pain.

## 2016-11-25 LAB — URINE CYTOLOGY ANCILLARY ONLY
Chlamydia: NEGATIVE
NEISSERIA GONORRHEA: NEGATIVE
TRICH (WINDOWPATH): POSITIVE — AB

## 2016-11-27 LAB — URINE CYTOLOGY ANCILLARY ONLY
Bacterial vaginitis: NEGATIVE
CANDIDA VAGINITIS: NEGATIVE

## 2017-01-28 ENCOUNTER — Ambulatory Visit: Payer: Medicaid Other | Admitting: Family Medicine

## 2017-01-30 ENCOUNTER — Encounter: Payer: Self-pay | Admitting: Family Medicine

## 2017-01-30 ENCOUNTER — Other Ambulatory Visit (HOSPITAL_COMMUNITY)
Admission: RE | Admit: 2017-01-30 | Discharge: 2017-01-30 | Disposition: A | Discharge status: Home / Self Care | Payer: Medicaid Other | Source: Ambulatory Visit | Attending: Family Medicine | Admitting: Family Medicine

## 2017-01-30 ENCOUNTER — Ambulatory Visit: Payer: Medicaid Other | Admitting: Family Medicine

## 2017-01-30 VITALS — BP 160/90 | HR 69 | Temp 98.0°F | Wt 211.8 lb

## 2017-01-30 DIAGNOSIS — Z202 Contact with and (suspected) exposure to infections with a predominantly sexual mode of transmission: Secondary | ICD-10-CM

## 2017-01-30 DIAGNOSIS — L930 Discoid lupus erythematosus: Secondary | ICD-10-CM | POA: Diagnosis not present

## 2017-01-30 DIAGNOSIS — K921 Melena: Secondary | ICD-10-CM

## 2017-01-30 LAB — POCT URINALYSIS DIP (MANUAL ENTRY)
BILIRUBIN UA: NEGATIVE
Blood, UA: NEGATIVE
Glucose, UA: NEGATIVE mg/dL
LEUKOCYTES UA: NEGATIVE
NITRITE UA: NEGATIVE
Protein Ur, POC: 100 mg/dL — AB
Spec Grav, UA: >=1.03 — AB (ref 1.010–1.025)
Urobilinogen, UA: 0.2 E.U./dL
pH, UA: 6 (ref 5.0–8.0)

## 2017-01-30 NOTE — Progress Notes (Signed)
   Subjective:   Patient ID: Ray Davis    DOB: 03/17/1975, 41 y.o. male   MRN: [id number]  CC: Hypertension, fu lupus  HPI: Ray Davis is a 41 y.o. male who presents to clinic today for hypertension and follow-up lupus.  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  Patient states he saw a rheumatologist September 26. He was referred to pain medicine and dermatology from the rheumatology office.  Patient was given a dermatology appointment in [place] on 02/12/17, however is still waiting to hear back from the pain medicine clinic.  He states he is in a lot of pain and strongly feels that doctors are not helping him.    During encounter, patient became increasingly frustrated and verbally abusive with me once told that continuing opiate medication would not be appropriate for his care.  He called his fiance who remained on speaker-phone the remainder of the visit and agreed with him.   I offered him Tramadol or any less addicting medication and he became very angry and began to yell as he had said previously said no to Tramadol at a prior visit and I did not recall this.  He stated that "nothing was being done for him" and he was being "left to suffer until pain med clinic gets back to him."  I had reviewed his chart prior to entering the room, however he stated that doctors don't know anything about him and that he did not value our medical opinion, he instead just wanted help and pain control.  I asked the patient to calm down so that we could have a discussion however he accused me of "having an attitude" with him.  I left the room to discuss this with the preceptor, Dr. Deirdre Priesthambliss, who also saw patient today.  We spoke to him at length regarding his care and that we in fact were trying to figure out how to best help him.  He continued to be uncooperative and disrespectful.   I therefore deferred the remainder of the encounter to Dr. Deirdre Priesthambliss who will  continue his care from now on.   ROS: Denies fever, chills, nausea, vomiting.  No abdominal pain or shortness of breath. PMFSH: Pertinent past medical, surgical, family, and social history were reviewed and updated as appropriate. Smoking status reviewed. Medications reviewed.  Objective:   BP (!) 160/90   Pulse 69   Temp 98 F (36.7 C) (Oral)   Wt 211 lb 12.8 oz (96.1 kg)   SpO2 98%   BMI 35.25 kg/m  Vitals and nursing note reviewed.  General: 41 year old male, NAD HEENT: Multiple patchy areas of discoid lupus over facial region and scalp Neck: supple, nontender CV: RRR no MRG Lungs: CTA B, nonlabored Abdomen: Soft, NT ND, positive bowel sounds Skin: warm, dry Extremities: warm and well perfused, normal tone Psych: Mood is irritable  Assessment & Plan:   Discoid lupus Per Rheumatology, patient will start Plaquenil 400 mg a day if G6PD comes back normal. Awaiting pain medicine clinic appointment.  Advised to keep Dermatology appointment on 12/26.   -Dr. Deirdre Priesthambliss [facility], please see additional note from today.   Orders Placed This Encounter  Procedures  . Basic Metabolic Panel  . POCT urinalysis dipstick   Freddrick MarchYashika Ulyana Pitones, MD [facility], PGY-2 02/04/2017 3:23 PM

## 2017-01-31 LAB — BASIC METABOLIC PANEL
BUN/Creatinine Ratio: 12 (ref 9–20)
BUN: 15 mg/dL (ref 6–24)
CALCIUM: 9.8 mg/dL (ref 8.7–10.2)
CO2: 24 mmol/L (ref 20–29)
Chloride: 107 mmol/L — ABNORMAL HIGH (ref 96–106)
Creatinine, Ser: 1.23 mg/dL (ref 0.76–1.27)
GFR, EST AFRICAN AMERICAN: 84 mL/min/{1.73_m2} (ref >59)
GFR, EST NON AFRICAN AMERICAN: 73 mL/min/{1.73_m2} (ref >59)
Glucose: 79 mg/dL (ref 65–99)
Potassium: 4.7 mmol/L (ref 3.5–5.2)
Sodium: 143 mmol/L (ref 134–144)

## 2017-01-31 LAB — URINE CYTOLOGY ANCILLARY ONLY
CHLAMYDIA, DNA PROBE: NEGATIVE
Neisseria Gonorrhea: NEGATIVE

## 2017-02-05 ENCOUNTER — Encounter: Payer: Self-pay | Admitting: Family Medicine

## 2017-02-05 NOTE — Progress Notes (Addendum)
On 12/13 I met with Ray. Kishi and Dr Reesa Chew.   We discussed issues of Narcotic Pain Medications - I reiterated I agreed with Dr Reesa Chew that it would be poor medical care to prescribe narcotics for pain that we think is related to his Lupus.  We could mask something and he would be better served by seeing his rheumatologist to treat the root of his pain  Profanity - I told Ray Davis that using profanity and being angry with his physicians made if difficult for [place] to give him the best care and that we would not tolerate this in our clinic.  If he continued to act angry and use profanity, we would dismiss him from our practice  Ray Welch was initially angry and made denigrating comments about our care but at the end of my visit with him was cooperative  After Dr Reesa Chew left I finished his office visit  STD exposure - he does not have any penile discharge or lesions or dysuria.  Urine was negative in the ER in October. Right lower chest abdomen pain - has had for weeks to months.   No vomiting or diarrhea or fever or shortness of breath  Lupus - is not taking plaquenil now.  Does not have a fu apt with rheum HTN - his wrist cuff is broken.  He is taking Lisinopril regularly.  No LH or CP or edema  O L - clear bilaterally no rales or diminished bs H - RRR no mgr  Plan  Lupus - FU with his rheumatologist ASAP. If he needs a referral he should let us know HTN - continue current meds, check BMET.  Bp likely elevated today due to emotional visit Flank pain - unsure of cause.  No sx of intraabdominal or pulmonary focal disease.  May be SLE related fu with rheumatology asap

## 2017-02-07 ENCOUNTER — Other Ambulatory Visit: Payer: Self-pay | Admitting: Family Medicine

## 2017-02-07 ENCOUNTER — Telehealth: Payer: Self-pay | Admitting: Family Medicine

## 2017-02-07 MED ORDER — TRIAMCINOLONE ACETONIDE 0.5 % EX OINT: 1.0000 "application " | TOPICAL_OINTMENT | Freq: Two times a day (BID) | CUTANEOUS | 1 refills | Status: DC

## 2017-02-07 NOTE — Telephone Encounter (Signed)
Pt said [facility] have been faxing over refill request for this pt and they havent gotten any responses. Pt doesn't know the name of the medications.

## 2017-02-07 NOTE — Telephone Encounter (Signed)
Would like result from latest blood work

## 2017-02-07 NOTE — Telephone Encounter (Signed)
Please let him know I sent in a new Rx  Also that I will be his new PCP.  He should make an appointment with me in a few months to check his blood pressure  Thanks  LC

## 2017-02-07 NOTE — Telephone Encounter (Signed)
Spoke with him   Said blood test looked ok except for a slight rise in creatinine and that he should follow up with his rheumatologist to see if perhaps the SLE was affecting the kidneys

## 2017-02-07 NOTE — Telephone Encounter (Signed)
Left message on patient's VM that refill has been sent and to make OV with new PCP in next few months. Kinnie FeilL. Zak Gondek, RN, BSN

## 2017-02-19 ENCOUNTER — Ambulatory Visit: Payer: Medicaid Other | Admitting: Family Medicine

## 2017-05-21 ENCOUNTER — Other Ambulatory Visit: Payer: Self-pay | Admitting: Family Medicine

## 2017-06-10 ENCOUNTER — Other Ambulatory Visit: Payer: Self-pay | Admitting: Family Medicine

## 2017-06-11 ENCOUNTER — Other Ambulatory Visit: Payer: Self-pay

## 2017-08-06 ENCOUNTER — Other Ambulatory Visit: Payer: Self-pay | Admitting: Family Medicine

## 2017-08-06 DIAGNOSIS — I10 Essential (primary) hypertension: Secondary | ICD-10-CM

## 2017-08-14 ENCOUNTER — Other Ambulatory Visit: Payer: Self-pay | Admitting: Gastroenterology

## 2017-08-14 DIAGNOSIS — R1011 Right upper quadrant pain: Secondary | ICD-10-CM

## 2017-08-22 ENCOUNTER — Other Ambulatory Visit: Payer: Medicaid Other

## 2017-10-04 ENCOUNTER — Other Ambulatory Visit: Payer: Self-pay | Admitting: Family Medicine

## 2017-10-04 DIAGNOSIS — I10 Essential (primary) hypertension: Secondary | ICD-10-CM

## 2017-10-30 ENCOUNTER — Ambulatory Visit
Admission: RE | Admit: 2017-10-30 | Discharge: 2017-10-30 | Disposition: A | Discharge status: Home / Self Care | Payer: Medicaid Other | Source: Ambulatory Visit | Attending: Gastroenterology | Admitting: Gastroenterology

## 2017-10-30 DIAGNOSIS — R1011 Right upper quadrant pain: Secondary | ICD-10-CM

## 2017-11-16 ENCOUNTER — Other Ambulatory Visit: Payer: Self-pay | Admitting: Family Medicine

## 2017-11-16 DIAGNOSIS — I10 Essential (primary) hypertension: Secondary | ICD-10-CM

## 2018-06-09 ENCOUNTER — Other Ambulatory Visit: Payer: Self-pay | Admitting: Family Medicine

## 2018-06-26 ENCOUNTER — Other Ambulatory Visit: Payer: Self-pay | Admitting: Family Medicine

## 2019-09-15 ENCOUNTER — Other Ambulatory Visit: Payer: Self-pay

## 2019-09-15 ENCOUNTER — Ambulatory Visit: Payer: Medicaid Other | Attending: Nurse Practitioner | Admitting: Physical Therapy

## 2019-09-15 ENCOUNTER — Encounter: Payer: Self-pay | Admitting: Physical Therapy

## 2019-09-15 VITALS — BP 131/100 | HR 66

## 2019-09-15 DIAGNOSIS — M6281 Muscle weakness (generalized): Secondary | ICD-10-CM | POA: Diagnosis present

## 2019-09-15 DIAGNOSIS — M25562 Pain in left knee: Secondary | ICD-10-CM | POA: Insufficient documentation

## 2019-09-15 DIAGNOSIS — M25561 Pain in right knee: Secondary | ICD-10-CM | POA: Diagnosis present

## 2019-09-15 DIAGNOSIS — G8929 Other chronic pain: Secondary | ICD-10-CM | POA: Diagnosis present

## 2019-09-15 DIAGNOSIS — R262 Difficulty in walking, not elsewhere classified: Secondary | ICD-10-CM | POA: Insufficient documentation
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
## 2019-09-22 ENCOUNTER — Telehealth: Payer: Self-pay | Admitting: Physical Therapy

## 2019-09-22 ENCOUNTER — Ambulatory Visit: Payer: Medicaid Other | Attending: Nurse Practitioner | Admitting: Physical Therapy

## 2019-09-22 NOTE — Telephone Encounter (Signed)
LVM regarding missed appointment to day, and noted his next appointment date and time. If he cannot make it to call and we can cancel that appointment for her.   Kailia Starry PT, DPT, LAT, ATC  09/22/19  11:29 AM

## 2019-09-27 ENCOUNTER — Ambulatory Visit: Payer: Medicaid Other | Admitting: Physical Therapy

## 2019-09-27 ENCOUNTER — Telehealth: Payer: Self-pay | Admitting: Physical Therapy

## 2019-09-27 NOTE — Telephone Encounter (Signed)
Called and spoke with patient regarding no show for therapy appointment this AM (also had no show for last scheduled appointment). Confirmed next appointment for 8/13 and reviewed therapy attendance policy/requested to call clinic if unable to attend or needing to reschedule.

## 2019-10-01 ENCOUNTER — Ambulatory Visit: Payer: Medicaid Other | Admitting: Physical Therapy

## 2020-01-18 ENCOUNTER — Other Ambulatory Visit: Payer: Self-pay | Admitting: Family Medicine

## 2020-09-20 ENCOUNTER — Ambulatory Visit: Payer: Medicaid Other | Admitting: Internal Medicine
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
